# Patient Record
Sex: Female | Born: 1995 | Race: Black or African American | Hispanic: No | Marital: Single | Smoking: Former smoker
Health system: Southern US, Community
[De-identification: ages and names within clinical notes are randomized; demographics above are authoritative.]

## PROBLEM LIST (undated history)

## (undated) DIAGNOSIS — E282 Polycystic ovarian syndrome: Secondary | ICD-10-CM

## (undated) DIAGNOSIS — Z8742 Personal history of other diseases of the female genital tract: Secondary | ICD-10-CM

## (undated) DIAGNOSIS — A749 Chlamydial infection, unspecified: Secondary | ICD-10-CM

## (undated) HISTORY — PX: NO PAST SURGERIES: SHX2092

## (undated) HISTORY — DX: Polycystic ovarian syndrome: E28.2

---
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
## 2015-05-03 ENCOUNTER — Other Ambulatory Visit (HOSPITAL_COMMUNITY)
Admission: RE | Admit: 2015-05-03 | Discharge: 2015-05-03 | Disposition: A | Discharge status: Home / Self Care | Payer: Self-pay | Source: Ambulatory Visit | Attending: Internal Medicine | Admitting: Internal Medicine

## 2015-05-03 ENCOUNTER — Emergency Department (INDEPENDENT_AMBULATORY_CARE_PROVIDER_SITE_OTHER)
Admission: EM | Admit: 2015-05-03 | Discharge: 2015-05-03 | Disposition: A | Discharge status: Home / Self Care | Payer: Self-pay | Source: Home / Self Care | Attending: Internal Medicine | Admitting: Internal Medicine

## 2015-05-03 ENCOUNTER — Encounter (HOSPITAL_COMMUNITY): Payer: Self-pay | Admitting: *Deleted

## 2015-05-03 DIAGNOSIS — N760 Acute vaginitis: Secondary | ICD-10-CM | POA: Insufficient documentation

## 2015-05-03 DIAGNOSIS — N730 Acute parametritis and pelvic cellulitis: Secondary | ICD-10-CM

## 2015-05-03 DIAGNOSIS — L309 Dermatitis, unspecified: Secondary | ICD-10-CM

## 2015-05-03 DIAGNOSIS — Z113 Encounter for screening for infections with a predominantly sexual mode of transmission: Secondary | ICD-10-CM | POA: Insufficient documentation

## 2015-05-03 LAB — POCT URINALYSIS DIP (DEVICE)
Bilirubin Urine: NEGATIVE
Glucose, UA: NEGATIVE mg/dL
Hgb urine dipstick: NEGATIVE
Ketones, ur: NEGATIVE mg/dL
Leukocytes, UA: NEGATIVE
Nitrite: NEGATIVE
Protein, ur: NEGATIVE mg/dL
Specific Gravity, Urine: 1.025 (ref 1.005–1.030)
Urobilinogen, UA: 1 mg/dL (ref 0.0–1.0)
pH: 6 (ref 5.0–8.0)

## 2015-05-03 LAB — POCT PREGNANCY, URINE: PREG TEST UR: NEGATIVE

## 2015-05-03 MED ORDER — AZITHROMYCIN 250 MG PO TABS
ORAL_TABLET | ORAL | Status: AC
  Filled 2015-05-03: qty 4

## 2015-05-03 MED ORDER — TRIAMCINOLONE ACETONIDE 0.5 % EX OINT: 1.0000 "application " | TOPICAL_OINTMENT | Freq: Two times a day (BID) | CUTANEOUS | Status: DC

## 2015-05-03 MED ORDER — AZITHROMYCIN 250 MG PO TABS
1000.0000 mg | ORAL_TABLET | Freq: Once | ORAL | Status: AC
  Administered 2015-05-03: 1000 mg via ORAL

## 2015-05-03 MED ORDER — CEFTRIAXONE SODIUM 250 MG IJ SOLR
INTRAMUSCULAR | Status: AC
  Filled 2015-05-03: qty 250

## 2015-05-03 MED ORDER — CEFTRIAXONE SODIUM 250 MG IJ SOLR
250.0000 mg | Freq: Once | INTRAMUSCULAR | Status: AC
  Administered 2015-05-03: 250 mg via INTRAMUSCULAR

## 2015-05-03 NOTE — ED Provider Notes (Signed)
CSN: [id number]     Arrival date & time 05/03/15  1304 History   First MD Initiated Contact with Patient 05/03/15 1344     Chief Complaint  Patient presents with  . Rash   HPI  19 year old patient with itchy rash on the back of her neck, also on the backs of her fingers. Rash on the back of the neck has been there for about a week; rash on the backs of fingers has been intermittent for years. Additional report of crampy lower abdominal discomfort yesterday; sexually active and not on birth control. Denies urinary symptoms. Denies unusual vaginal discharge or bleeding. Last menstrual period was 03/11/2015; that was the only. She had last year. No fever, malaise.  History reviewed. No pertinent past medical history. History reviewed. No pertinent past surgical history. Family History  Problem Relation Age of Onset  . Asthma Mother   . Diabetes Father    Social History  Substance Use Topics  . Smoking status: Passive Smoke Exposure - Never Smoker  . Smokeless tobacco: None  . Alcohol Use: No    Review of Systems  All other systems reviewed and are negative.   Allergies  Review of patient's allergies indicates no known allergies.  Home Medications  none    BP 111/76 mmHg  Pulse 76  Temp(Src) 98 F (36.7 C) (Oral)  Resp 16  SpO2 98%  LMP 03/11/2015   Physical Exam  Constitutional: She is oriented to person, place, and time. No distress.  Alert, nicely groomed  HENT:  Head: Atraumatic.  Eyes:  Conjugate gaze, no eye redness/drainage  Neck: Neck supple.  Cardiovascular: Normal rate.   Pulmonary/Chest: No respiratory distress.  Abdominal: She exhibits no distension.  Genitourinary:  Normal external female genitalia Copious thin white discharge, no odor Cervix and vaginal mucosa appear inflamed, somewhat friable, no focal lesions Cervical motion tenderness present, left greater than right adnexal tenderness, no adnexal enlargement  Musculoskeletal: Normal range of  motion.  No leg swelling  Neurological: She is alert and oriented to person, place, and time.  Skin: Skin is warm and dry.  No cyanosis Flaky elevated rash on the back of the neck, nontender Lichenification on the backs of the dorsal, distal fingers, occasional fissuring  Nursing note and vitals reviewed.   ED Course  Procedures (including critical care time)  Labs Review  Results for orders placed or performed during the hospital encounter of 05/03/15  POCT urinalysis dip (device)  Result Value Ref Range   Glucose, UA NEGATIVE NEGATIVE mg/dL   Bilirubin Urine NEGATIVE NEGATIVE   Ketones, ur NEGATIVE NEGATIVE mg/dL   Specific Gravity, Urine 1.025 1.005 - 1.030   Hgb urine dipstick NEGATIVE NEGATIVE   pH 6.0 5.0 - 8.0   Protein, ur NEGATIVE NEGATIVE mg/dL   Urobilinogen, UA 1.0 0.0 - 1.0 mg/dL   Nitrite NEGATIVE NEGATIVE   Leukocytes, UA NEGATIVE NEGATIVE  Pregnancy, urine POC  Result Value Ref Range   Preg Test, Ur NEGATIVE NEGATIVE   Swabs for gonorrhea, chlamydia, and wet prep are pending   MDM   1. PID (acute pelvic inflammatory disease)   2. Dermatitis    Meds ordered this encounter  Medications  . cefTRIAXone (ROCEPHIN) injection 250 mg    Sig:   . azithromycin (ZITHROMAX) tablet 1,000 mg    Sig:   . triamcinolone ointment (KENALOG) 0.5 %    Sig: Apply 1 application topically 2 (two) times daily.    Dispense:  453.6 g  Refill:  0    Recheck for persistent symptoms   Eustace Moore, MD 05/09/15 1233

## 2015-05-03 NOTE — Discharge Instructions (Signed)
Urine and pregnancy tests were negative today.  Pelvic exam suggests pelvic inflammatory disease.  Injection of rocephin  and oral dose of zithromax  were given at the urgent care today.  Tests for gonorrhea/chlamydia are pending.  Recheck if pelvic discomfort is not improving in a few days. Rash on hands/neck is suggestive of psoriasis.  A prescription for triamcinolone ointment was sent to the [facility] on [address]  Pelvic Inflammatory Disease Pelvic inflammatory disease (PID) is an infection in some or all of the female organs. PID can be in the uterus, ovaries, fallopian tubes, or the surrounding tissues that are inside the lower belly area (pelvis). PID can lead to lasting problems if it is not treated. To check for this disease, your doctor may:  Do a physical exam.  Do blood tests, urine tests, or a pregnancy test.  Look at your vaginal discharge.  Do tests to look inside the pelvis.  Test you for other infections. HOME CARE  Take over-the-counter and prescription medicines only as told by your doctor.  If you were prescribed an antibiotic medicine, take it as told by your doctor. Do not stop taking it even if you start to feel better.  Do not have sex until treatment is done or as told by your doctor.  Tell your sex partner if you have PID. Your partner may need to be treated.  Keep all follow-up visits as told by your doctor. This is important.  Your doctor may test you for infection again 3 months after you are treated. GET HELP IF:  You have more fluid (discharge) coming from your vagina or fluid that is not normal.  Your pain does not improve.  You throw up (vomit).  You have a fever.  You cannot take your medicines.  Your partner has a sexually transmitted disease (STD).  You have pain when you pee (urinate). GET HELP RIGHT AWAY IF:  You have more belly (abdominal) or lower belly pain.  You have chills.  You are not better after 72 hours.     This information is not intended to replace advice given to you by your health care provider. Make sure you discuss any questions you have with your health care provider.   Document Released: 06/05/2008 Document Revised: 11/28/2014 Document Reviewed: 04/16/2014 Elsevier Interactive Patient Education [employer]

## 2015-05-03 NOTE — ED Notes (Signed)
Pt      Informed   To  Abstain from   Sex  For  10  Days    Phone  Number     Verified        Told  To   Remains  Condom

## 2015-05-03 NOTE — ED Notes (Signed)
Pt  Reports     Skin  Rash      Back  Of  Neck  And  Hands          X  2  Weeks    Works  At  [employer]   She  Also  Reports  Low  abd  Pain  And late  On  Her  Period

## 2015-05-03 NOTE — ED Notes (Signed)
Patient is unable to void at this time 

## 2015-05-06 LAB — CERVICOVAGINAL ANCILLARY ONLY
Chlamydia: NEGATIVE
Neisseria Gonorrhea: NEGATIVE

## 2015-05-07 LAB — CERVICOVAGINAL ANCILLARY ONLY: Wet Prep (BD Affirm): NEGATIVE

## 2015-05-13 ENCOUNTER — Telehealth (HOSPITAL_COMMUNITY): Payer: Self-pay | Admitting: Emergency Medicine

## 2015-05-13 NOTE — ED Notes (Signed)
Called pt and notified of recent lab results from visit 2/10 Pt ID'd properly... Reports feeling better and sx have subsided  Per Dr. Dayton Scrape,  Tests for yeast/bacterial vaginosis/trichomonas, and for chlamydia/gonorrhea were negative. Recheck for persistent symptoms. Ria Clock MD  Adv pt if sx are not getting better to return  Education on safe sex given Pt verb understanding.

## 2015-07-26 ENCOUNTER — Emergency Department (HOSPITAL_COMMUNITY)
Admission: EM | Admit: 2015-07-26 | Discharge: 2015-07-26 | Disposition: A | Discharge status: Home / Self Care | Payer: Medicaid Other | Attending: Emergency Medicine | Admitting: Emergency Medicine

## 2015-07-26 ENCOUNTER — Encounter (HOSPITAL_COMMUNITY): Payer: Self-pay

## 2015-07-26 DIAGNOSIS — Z3202 Encounter for pregnancy test, result negative: Secondary | ICD-10-CM | POA: Insufficient documentation

## 2015-07-26 DIAGNOSIS — R519 Headache, unspecified: Secondary | ICD-10-CM

## 2015-07-26 DIAGNOSIS — R52 Pain, unspecified: Secondary | ICD-10-CM

## 2015-07-26 DIAGNOSIS — R509 Fever, unspecified: Secondary | ICD-10-CM | POA: Insufficient documentation

## 2015-07-26 DIAGNOSIS — R6883 Chills (without fever): Secondary | ICD-10-CM

## 2015-07-26 DIAGNOSIS — R000 Tachycardia, unspecified: Secondary | ICD-10-CM | POA: Insufficient documentation

## 2015-07-26 DIAGNOSIS — R51 Headache: Secondary | ICD-10-CM | POA: Insufficient documentation

## 2015-07-26 LAB — URINALYSIS, ROUTINE W REFLEX MICROSCOPIC
Bilirubin Urine: NEGATIVE
GLUCOSE, UA: NEGATIVE mg/dL
KETONES UR: 15 mg/dL — AB
Nitrite: NEGATIVE
PH: 8 (ref 5.0–8.0)
Protein, ur: 100 mg/dL — AB
SPECIFIC GRAVITY, URINE: 1.015 (ref 1.005–1.030)

## 2015-07-26 LAB — URINE MICROSCOPIC-ADD ON

## 2015-07-26 LAB — POC URINE PREG, ED: Preg Test, Ur: NEGATIVE

## 2015-07-26 MED ORDER — ACETAMINOPHEN 500 MG PO TABS
1000.0000 mg | ORAL_TABLET | Freq: Once | ORAL | Status: AC
  Administered 2015-07-26: 1000 mg via ORAL
  Filled 2015-07-26: qty 2

## 2015-07-26 MED ORDER — IBUPROFEN 800 MG PO TABS
800.0000 mg | ORAL_TABLET | Freq: Once | ORAL | Status: AC
  Administered 2015-07-26: 800 mg via ORAL
  Filled 2015-07-26: qty 1

## 2015-07-26 NOTE — ED Notes (Signed)
Pt states she understands instructions, Home with friend with steady gait.

## 2015-07-26 NOTE — ED Notes (Signed)
Pt started feeling bad last night with chills, headache and body aches.

## 2015-07-26 NOTE — Discharge Instructions (Signed)
You have been seen today for body aches, chills, and headache. Your symptoms are consistent with a viral illness. Viruses do not require antibiotics. Treatment is symptomatic care. Drink plenty of fluids and get plenty of rest. You should be drinking at least a liter of water an hour to stay hydrated. Ibuprofen or Tylenol for pain or fever. Follow up with PCP as needed. Return to ED should symptoms worsen.  RESOURCE GUIDE  Chronic Pain Problems: Contact [facility]  [phone] Patients need to be referred by their primary care doctor.  Insufficient Money for Medicine: Contact United Way:  call "211" or [facility] [phone].  No Primary Care Doctor: - Call [facility]  [phone] - can help you locate a primary care doctor that  accepts your insurance, provides certain services, etc. - Physician Referral Service- [phone]  Agencies that provide inexpensive medical care: - [facility]  [phone] - [facility]  [phone] - [facility]  [phone] - Women's Clinic  [phone] - Planned Parenthood  [phone] [facility] Bast[facility]  [phone]  Medicaid-accepting [facility] Providers: - [facility]- [address], [address]  [phone], Mon-Fri 9am-7pm, Sat 9am-1pm - [facility]- [address], [address] [place]  [phone] - [facility]- [address], [address] [place]  [phone] [facility]- [address]  [phone] - Renaye RakersVeita Bland- [address], [address], [phone]  Only accepts [place] patients after they have their name  applied to their card  Self Pay (no insurance) in [place]: - Sickle Cell Patients: Dr Willey BladeEric Dean, [facility]  [address], [phone] - [facility]- [address]  [phone]       Redge Gainer-     [facility] [place]- [address]       -     [facility]- see information above (Speak to CitigroupPam H if you do not have insurance)       -  [facility]- [address], [phone]       -  [facility]- [address],  [phone]       -  [facility]- [address] [place] [address], [phone]       -  Dr Julio Sickssei-Bonsu-  [address], [address], [place], [phone]       -  [facility]- [address], [phone]       -  [facility] [place]- [address], [phone], also [address], [phone]       -    [facility]- [address], [phone], 1st & 3rd Saturday   every month, 10am-1pm  1) Find a Doctor and Pay Out of Pocket Although you won't have to find out who is covered by your insurance plan, it is a good idea to ask around and get recommendations. You will then need to call the office and see if the doctor you have chosen will accept you as a new patient and what types of options they offer for patients who are self-pay. Some doctors offer discounts or will set up payment plans for their patients who do not have insurance, but you will need to ask so you aren't surprised when you get to your  appointment.  2) Contact Your Local Health Department Not all health departments have doctors that can see patients for sick visits, but many do, so it is worth a call to see if yours does. If you don't know where your local health department is, you can check in your phone book. The CDC also has a tool to help you locate your state's health department, and many state websites also have listings of all of their local health departments.  3) Find a Walk-in Clinic If your illness is not likely to be very severe or complicated, you may want to try a walk in clinic. These are popping up all over the country in pharmacies, drugstores, and shopping centers. They're usually staffed by nurse practitioners or physician assistants that have been trained to treat  common illnesses and complaints. They're usually fairly quick and inexpensive. However, if you have serious medical issues or chronic medical problems, these are probably not your best option  STD Testing - [facility] Department of [facility], STD Clinic, [address], [place], phone [phone] or [phone].  Monday - Friday, call for an appointment. [facility] of [employer], STD Clinic, [address], [place], phone [phone] or [phone].  Monday - Friday, call for an appointment.  Abuse/Neglect: [facility] Child Abuse Hotline 210-210-9296 [facility] Child Abuse Hotline [phone] (After Hours)  Emergency Shelter:  [facility] [phone]  Maternity Homes: - Room at the [place] [facility] 8025287775 - [facility] [phone]  MRSA Hotline #:   807-410-8654  [facility] Resources  [facility] [place]  United Way [facility] Dept. [address]                 [address]         [address]  Blondell Reveal Phone:  [phone]                                  Phone:  [phone]                   Phone:  [phone]  [facility], [phone] - [phone]       -     [facility] in [place], [address]                                  [phone], [facility] Child Abuse Hotline 704-321-8686 or [phone] (After Hours)   Behavioral Health Services  Substance Abuse Resources: - [facility] Drug [facility]  [phone] - [facility] [phone] - [facility] [phone] Floydene Flock [phone] - Residential & Outpatient Substance Abuse Program   4137189138  Psychological Services: - [facility]  [place]  [place], [place] [address] [place], [place]: [phone] or [phone], PicCapture.uy  Dental Assistance  If unable to pay or uninsured, contact:  [facility] or [facility]. to become qualified for the adult dental clinic.  Patients with Medicaid: [facility] [phone] [address], [place] [address], [phone]  If unable to pay, or uninsured, contact [facility] [phone]) or [place] [phone] in [place], [place] in [facility]) to become qualified for the adult dental clinic   Other [place], [place], [place], [address], [place], [place], 2nd and 4th Thursday of the month at 6:30am.  10 clients each day by appointment, can sometimes see walk-in patients if someone does not show for an appointment. [facility]- [address] [place]ard Danker [place], [place], [address], [place], [place], [place], [address], [place] Department- [place] Department- [place] Department- [phone]

## 2015-07-26 NOTE — ED Provider Notes (Signed)
CSN: [id number]     Arrival date & time 07/26/15  [id number] History   First MD Initiated Contact with Patient 07/26/15 [phone]     Chief Complaint  Patient presents with  . Influenza     (Consider location/radiation/quality/duration/timing/severity/associated sxs/prior Treatment) HPI   [place] D Jean RosenthalJackson is a [age] y.o. female, patient with no pertinent past medical history, presenting to the ED with body aches and chills beginning last night. Pt also endorses a headache that began several days ago. Pt rates her body aches and headache at 8/10. Headache is aching, nonradiating. Pt has had headaches before that are similar to this one. Pt endorses contact with a niece with similar symptoms. Denies tick bites or time in forested areas. LMP April 11, but is currently menstruating. Pt requests a pregnancy test. Pt denies cough, shortness of breath, rash, N/V/C/D, or any other complaints.     History reviewed. No pertinent past medical history. History reviewed. No pertinent past surgical history. Family History  Problem Relation Age of Onset  . Asthma Mother   . Diabetes Father    Social History  Substance Use Topics  . Smoking status: Passive Smoke Exposure - Never Smoker    Types: Cigarettes  . Smokeless tobacco: None  . Alcohol Use: Yes   OB History    No data available     Review of Systems  Constitutional: Positive for fever and chills. Negative for diaphoresis.  Respiratory: Negative for cough and shortness of breath.   Gastrointestinal: Negative for nausea, vomiting, abdominal pain, diarrhea and constipation.  Genitourinary: Negative for dysuria, hematuria and flank pain.  Musculoskeletal: Positive for myalgias. Negative for arthralgias.  Skin: Negative for color change, pallor and rash.  Neurological: Positive for headaches. Negative for dizziness, weakness, light-headedness and numbness.  All other systems reviewed and are negative.     Allergies  Review of patient's allergies  indicates no known allergies.  Home Medications   Prior to Admission medications   Medication Sig Start Date End Date Taking? Authorizing Provider  triamcinolone ointment (KENALOG) 0.5 % Apply 1 application topically 2 (two) times daily. Patient not taking: Reported on 07/26/2015 05/03/15   Eustace MooreLaura W Murray, MD   BP 98/59 mmHg  Pulse 107  Temp(Src) 99.6 F (37.6 C) (Oral)  Resp 14  Ht 5\' 4"  (1.626 m)  Wt 90.719 kg  BMI 34.31 kg/m2  SpO2 98%  LMP 07/26/2015 Physical Exam  Constitutional: She is oriented to person, place, and time. She appears well-developed and well-nourished. No distress.  HENT:  Head: Normocephalic and atraumatic.  Eyes: Conjunctivae and EOM are normal. Pupils are equal, round, and reactive to light.  Neck: Normal range of motion. Neck supple.  Cardiovascular: Regular rhythm, normal heart sounds and intact distal pulses.  Tachycardia present.   Pulmonary/Chest: Effort normal and breath sounds normal. No respiratory distress.  Abdominal: Soft. There is no tenderness. There is no guarding.  Musculoskeletal: She exhibits no edema or tenderness.  Full ROM in all extremities and spine. No paraspinal tenderness.   Lymphadenopathy:    She has no cervical adenopathy.  Neurological: She is alert and oriented to person, place, and time. She has normal reflexes.  No sensory deficits. Strength 5/5 in all extremities. No gait disturbance. Coordination intact. Cranial nerves III-XII grossly intact. No facial droop.   Skin: Skin is warm and dry. She is not diaphoretic.  Psychiatric: She has a normal mood and affect. Her behavior is normal.  Nursing note and vitals reviewed.  ED Course  Procedures (including critical care time) Labs Review Labs Reviewed  URINALYSIS, ROUTINE W REFLEX MICROSCOPIC (NOT AT [facility]) - Abnormal; Notable for the following:    Color, Urine RED (*)    APPearance TURBID (*)    Hgb urine dipstick LARGE (*)    Ketones, ur 15 (*)    Protein, ur 100 (*)     Leukocytes, UA SMALL (*)    All other components within normal limits  URINE MICROSCOPIC-ADD ON - Abnormal; Notable for the following:    Squamous Epithelial / LPF 0-5 (*)    Bacteria, UA RARE (*)    All other components within normal limits  POC URINE PREG, ED    Imaging Review No results found. I have personally reviewed and evaluated these lab results as part of my medical decision-making.   EKG Interpretation None      MDM   Final diagnoses:  Body aches  Chills  Acute nonintractable headache, unspecified headache type    [place] D Lunt presents with body aches and chills that began last night. Headache began a few days ago.  Patient has no neurologic or functional deficits. Symptoms consistent with a viral illness. Patient is nontoxic appearing, afebrile, not tachycardic, maintains SPO2 of 96-100% on room air, and is in no apparent distress. Patient has no signs of sepsis or other serious or life-threatening condition. Very low suspicion for ailments such as meningitis or [facility] Spotted Fever. Patient able to readily pass PO challenge without difficulty. Home care and return precautions discussed. Patient to follow up with PCP should symptoms continue. Patient voiced understanding of these instructions and is comfortable with discharge.  Filed Vitals:   07/26/15 0646 07/26/15 0652 07/26/15 0652 07/26/15 0654  BP: 127/74     Pulse:  123    Temp:      TempSrc:      Height:  (1.626 m)     Weight:    90.719 kg  SpO2:  97% 96%    Filed Vitals:   07/26/15 0654 07/26/15 0820 07/26/15 0936 07/26/15 0956  BP:  103/55 98/59   Pulse:  113 107   Temp:  102.7 F (39.3 C)  99.6 F (37.6 C)  TempSrc:  Oral  Oral  Resp:  18 14   Height:      Weight: 90.719 kg     SpO2:  98% 98%         Anselm Pancoast, PA-C 07/26/15 1515  Derwood Kaplan, MD 07/27/15 1210

## 2015-07-26 NOTE — ED Notes (Signed)
Cranberry juice given for PO challenge.  Pt able to ambulate to bathroom, clean catch instructions given.

## 2015-07-29 ENCOUNTER — Encounter (HOSPITAL_COMMUNITY): Payer: Self-pay | Admitting: Emergency Medicine

## 2015-07-29 DIAGNOSIS — H9201 Otalgia, right ear: Secondary | ICD-10-CM | POA: Insufficient documentation

## 2015-07-29 DIAGNOSIS — J029 Acute pharyngitis, unspecified: Secondary | ICD-10-CM | POA: Insufficient documentation

## 2015-07-29 LAB — RAPID STREP SCREEN (MED CTR MEBANE ONLY): Streptococcus, Group A Screen (Direct): POSITIVE — AB

## 2015-07-29 NOTE — ED Notes (Signed)
Pt here for sore throat and otalgia. Pt sts she was seen here on Friday for similar symptoms. Pt a/o x 4, resp e/u.

## 2015-07-30 ENCOUNTER — Emergency Department (HOSPITAL_COMMUNITY)
Admission: EM | Admit: 2015-07-30 | Discharge: 2015-07-30 | Disposition: A | Discharge status: Home / Self Care | Payer: Medicaid Other | Attending: Emergency Medicine | Admitting: Emergency Medicine

## 2015-07-30 DIAGNOSIS — H9201 Otalgia, right ear: Secondary | ICD-10-CM

## 2015-07-30 DIAGNOSIS — J029 Acute pharyngitis, unspecified: Secondary | ICD-10-CM

## 2015-07-30 MED ORDER — KETOROLAC TROMETHAMINE 60 MG/2ML IM SOLN
60.0000 mg | Freq: Once | INTRAMUSCULAR | Status: AC
  Administered 2015-07-30: 60 mg via INTRAMUSCULAR
  Filled 2015-07-30: qty 2

## 2015-07-30 MED ORDER — DEXAMETHASONE SODIUM PHOSPHATE 10 MG/ML IJ SOLN
10.0000 mg | Freq: Once | INTRAMUSCULAR | Status: AC
  Administered 2015-07-30: 10 mg via INTRAMUSCULAR
  Filled 2015-07-30: qty 1

## 2015-07-30 NOTE — Discharge Instructions (Signed)
Earache Linda Nunez, your ears do not show any signs of infection.  You were given medication to treat your pain with your throat and ear.  See a primary care doctor within 3 days for close follow up. Take tylenol and ibuprofen at home as needed for pain. If symptoms worsen, come back to the ED immediately. Thank you. An earache, also called otalgia, can be caused by many things. Pain from an earache can be sharp, dull, or burning. The pain may be temporary or constant. Earaches can be caused by problems with the ear, such as infection in either the middle ear or the ear canal, injury, impacted ear wax, middle ear pressure, or a foreign body in the ear. Ear pain can also result from problems in other areas. This is called referred pain. For example, pain can come from a sore throat, a tooth infection, or problems with the jaw or the joint between the jaw and the skull (temporomandibular joint, or TMJ). The cause of an earache is not always easy to identify. Watchful waiting may be appropriate for some earaches until a clear cause of the pain can be found. HOME CARE INSTRUCTIONS Watch your condition for any changes. The following actions may help to lessen any discomfort that you are feeling:  Take medicines only as directed by your health care provider. This includes ear drops.  Apply ice to your outer ear to help reduce pain.  Put ice in a plastic bag.  Place a towel between your skin and the bag.  Leave the ice on for 20 minutes, 2-3 times per day.  Do not put anything in your ear other than medicine that is prescribed by your health care provider.  Try resting in an upright position instead of lying down. This may help to reduce pressure in the middle ear and relieve pain.  Chew gum if it helps to relieve your ear pain.  Control any allergies that you have.  Keep all follow-up visits as directed by your health care provider. This is important. SEEK MEDICAL CARE IF:  Your pain does not  improve within 2 days.  You have a fever.  You have new or worsening symptoms. SEEK IMMEDIATE MEDICAL CARE IF:  You have a severe headache.  You have a stiff neck.  You have difficulty swallowing.  You have redness or swelling behind your ear.  You have drainage from your ear.  You have hearing loss.  You feel dizzy.   This information is not intended to replace advice given to you by your health care provider. Make sure you discuss any questions you have with your health care provider.   Document Released: 10/25/2003 Document Revised: 03/30/2014 Document Reviewed: 10/08/2013 Elsevier Interactive Patient Education 2016 Elsevier Inc[employer] Pharyngitis Pharyngitis is a sore throat (pharynx). There is redness, pain, and swelling of your throat. HOME CARE   Drink enough fluids to keep your pee (urine) clear or pale yellow.  Only take medicine as told by your doctor.  You may get sick again if you do not take medicine as told. Finish your medicines, even if you start to feel better.  Do not take aspirin.  Rest.  Rinse your mouth (gargle) with salt water ( tsp of salt per 1 qt of water) every 1-2 hours. This will help the pain.  If you are not at risk for choking, you can suck on hard candy or sore throat lozenges. GET HELP IF:  You have large, tender lumps on your neck.  You have a rash.  You cough up green, yellow-brown, or bloody spit. GET HELP RIGHT AWAY IF:   You have a stiff neck.  You drool or cannot swallow liquids.  You throw up (vomit) or are not able to keep medicine or liquids down.  You have very bad pain that does not go away with medicine.  You have problems breathing (not from a stuffy nose). MAKE SURE YOU:   Understand these instructions.  Will watch your condition.  Will get help right away if you are not doing well or get worse.   This information is not intended to replace advice given to you by your health care provider. Make sure you  discuss any questions you have with your health care provider.   Document Released: 08/26/2007 Document Revised: 12/28/2012 Document Reviewed: 11/14/2012 Elsevier Interactive Patient Education [employer] Elsevier Inc[employer]

## 2015-07-30 NOTE — ED Provider Notes (Signed)
CSN: [id number]     Arrival date & time 07/29/15  2150 History  By signing my name below, I, Arianna Nassar, attest that this documentation has been prepared under the direction and in the presence of Tomasita Crumble, MD. Electronically Signed: Octavia Heir, ED Scribe. 07/30/2015. 4:01 AM.    Chief Complaint  Patient presents with  . Sore Throat  . Otalgia      The history is provided by the patient. No language interpreter was used.   HPI Comments: Linda Nunez is a 20 y.o. female who presents to the Emergency Department complaining of constant, gradual worsening, moderate sore throat with associated right ear pain onset 3 days ago. Pt was seen on Friday for the same symptoms and received tylenol to alleviate her symptoms with no relief. She has not taken any medication recently to alleviate her symptoms. She denies fever.   History reviewed. No pertinent past medical history. History reviewed. No pertinent past surgical history. Family History  Problem Relation Age of Onset  . Asthma Mother   . Diabetes Father    Social History  Substance Use Topics  . Smoking status: Passive Smoke Exposure - Never Smoker    Types: Cigarettes  . Smokeless tobacco: None  . Alcohol Use: Yes   OB History    No data available     Review of Systems  A complete 10 system review of systems was obtained and all systems are negative except as noted in the HPI and PMH.    Allergies  Review of patient's allergies indicates no known allergies.  Home Medications   Prior to Admission medications   Medication Sig Start Date End Date Taking? Authorizing Provider  triamcinolone ointment (KENALOG) 0.5 % Apply 1 application topically 2 (two) times daily. Patient not taking: Reported on 07/26/2015 05/03/15   Eustace Moore, MD   Triage vitals: BP 103/71 mmHg  Pulse 95  Temp(Src) 99.6 F (37.6 C) (Oral)  Resp 16  Ht  (1.626 m)  Wt 196 lb 6.4 oz (89.086 kg)  BMI 33.70 kg/m2  SpO2 96%  LMP  07/25/2015 Physical Exam  Constitutional: She is oriented to person, place, and time. She appears well-developed and well-nourished. No distress.  HENT:  Head: Normocephalic and atraumatic.  Nose: Nose normal.  Mouth/Throat: Oropharynx is clear and moist. No oropharyngeal exudate.  No swollen tonsillar, no external ear tenderness, no tonsillar exudates  Eyes: Conjunctivae and EOM are normal. Pupils are equal, round, and reactive to light. No scleral icterus.  Neck: Normal range of motion. Neck supple. No JVD present. No tracheal deviation present. No thyromegaly present.  Cardiovascular: Normal rate, regular rhythm and normal heart sounds.  Exam reveals no gallop and no friction rub.   No murmur heard. Pulmonary/Chest: Effort normal and breath sounds normal. No respiratory distress. She has no wheezes. She exhibits no tenderness.  Abdominal: Soft. Bowel sounds are normal. She exhibits no distension and no mass. There is no tenderness. There is no rebound and no guarding.  Musculoskeletal: Normal range of motion. She exhibits no edema or tenderness.  Lymphadenopathy:    She has no cervical adenopathy.  Neurological: She is alert and oriented to person, place, and time. No cranial nerve deficit. She exhibits normal muscle tone.  Skin: Skin is warm and dry. No rash noted. No erythema. No pallor.  Nursing note and vitals reviewed.   ED Course  Procedures  DIAGNOSTIC STUDIES: Oxygen Saturation is 96% on RA, adequate by my interpretation.  COORDINATION OF CARE:  3:54 AM Discussed treatment plan with pt at bedside and pt agreed to plan.  Labs Review Labs Reviewed  RAPID STREP SCREEN (NOT AT [facility]) - Abnormal; Notable for the following:    Streptococcus, Group A Screen (Direct) POSITIVE (*)    All other components within normal limits    Imaging Review No results found. I have personally reviewed and evaluated these images and lab results as part of my medical decision-making.   EKG  Interpretation None      MDM   Final diagnoses:  None   Patient presents emergency department for sore throat and right ear pain. Physical exam is unremarkable. She likely has a pharyngitis. Strep test was ordered by triage and not clinically indicated.   I do not believe antibiotics are indicated as the patient may be colonized with group A strep. She was given Toradol and Decadron for pain control. Primary care follow-up advised. Tylenol and ibuprofen to be used at home. She appears well and in no acute distress, vital signs within her normal limits and she is safe for discharge.   I personally performed the services described in this documentation, which was scribed in my presence. The recorded information has been reviewed and is accurate.     Tomasita CrumbleAdeleke Niema Carrara, MD 07/30/15 [id number]

## 2015-12-26 ENCOUNTER — Encounter (HOSPITAL_COMMUNITY): Payer: Self-pay | Admitting: *Deleted

## 2015-12-26 DIAGNOSIS — L739 Follicular disorder, unspecified: Secondary | ICD-10-CM | POA: Insufficient documentation

## 2015-12-26 DIAGNOSIS — Z7722 Contact with and (suspected) exposure to environmental tobacco smoke (acute) (chronic): Secondary | ICD-10-CM | POA: Insufficient documentation

## 2015-12-26 NOTE — ED Triage Notes (Signed)
Pt c/o bumps on her legs, thigh, and chest

## 2015-12-27 ENCOUNTER — Emergency Department (HOSPITAL_COMMUNITY)
Admission: EM | Admit: 2015-12-27 | Discharge: 2015-12-27 | Disposition: A | Discharge status: Home / Self Care | Payer: Medicaid Other | Attending: Emergency Medicine | Admitting: Emergency Medicine

## 2015-12-27 DIAGNOSIS — L739 Follicular disorder, unspecified: Secondary | ICD-10-CM

## 2015-12-27 MED ORDER — SULFAMETHOXAZOLE-TRIMETHOPRIM 800-160 MG PO TABS
1.0000 | ORAL_TABLET | Freq: Once | ORAL | Status: AC
  Administered 2015-12-27: 1 via ORAL
  Filled 2015-12-27: qty 1

## 2015-12-27 MED ORDER — SULFAMETHOXAZOLE-TRIMETHOPRIM 800-160 MG PO TABS: 1.0000 | ORAL_TABLET | Freq: Two times a day (BID) | ORAL | 0 refills | Status: AC

## 2015-12-27 NOTE — ED Provider Notes (Signed)
[facility]-EMERGENCY DEPT Provider Note   CSN: [id number] Arrival date & time: 12/26/15  2353     History   Chief Complaint Chief Complaint  Patient presents with  . Rash    HPI Linda Nunez is a 20 y.o. female.  HPI Presents with concern of cutaneous lesions. Symptoms began 2 days ago, since that time she has multiple widely spread small erythematous, P Linda lesions. No fever, chills, nausea, vomiting, other systemic complaints. She notes one prior similar episode 2 years ago, diagnosed with MRSA infection.  History reviewed. No pertinent past medical history.  There are no active problems to display for this patient.   History reviewed. No pertinent surgical history.  OB History    No data available       Home Medications    Prior to Admission medications   Medication Sig Start Date End Date Taking? Authorizing Provider  sulfamethoxazole-trimethoprim (BACTRIM DS,SEPTRA DS) 800-160 MG tablet Take 1 tablet by mouth 2 (two) times daily. 12/27/15 01/03/16  Gerhard Munch, MD  triamcinolone ointment (KENALOG) 0.5 % Apply 1 application topically 2 (two) times daily. Patient not taking: Reported on 07/26/2015 05/03/15   Eustace Moore, MD    Family History Family History  Problem Relation Age of Onset  . Asthma Mother   . Diabetes Father     Social History Social History  Substance Use Topics  . Smoking status: Passive Smoke Exposure - Never Smoker    Types: Cigarettes  . Smokeless tobacco: Never Used  . Alcohol use Yes     Allergies   Review of patient's allergies indicates no known allergies.   Review of Systems Review of Systems  Constitutional:       Per HPI, otherwise negative  HENT:       Per HPI, otherwise negative  Respiratory:       Per HPI, otherwise negative  Cardiovascular:       Per HPI, otherwise negative  Gastrointestinal: Negative for vomiting.  Endocrine:       Negative aside from HPI  Genitourinary:       Neg aside from HPI     Musculoskeletal:       Per HPI, otherwise negative  Skin: Negative.   Neurological: Negative for syncope.     Physical Exam Updated Vital Signs BP 120/78 (BP Location: Left Arm)   Pulse 102   Temp 98.8 F (37.1 C) (Oral)   Resp 16   SpO2 98%   Physical Exam  Constitutional: She is oriented to person, place, and time. She appears well-developed and well-nourished. No distress.  HENT:  Head: Normocephalic and atraumatic.  Eyes: Conjunctivae and EOM are normal.  Cardiovascular: Normal rate and regular rhythm.   Pulmonary/Chest: Effort normal and breath sounds normal. No stridor. No respiratory distress.  Abdominal: She exhibits no distension.  Musculoskeletal: She exhibits no edema.  Neurological: She is alert and oriented to person, place, and time. No cranial nerve deficit.  Skin: Skin is warm and dry. Rash noted.  Throughout the habitus there are multiple areas of erythema, small, less than 1 cm, somewhat purulent focal central areas. No confluent erythema anywhere.   Psychiatric: She has a normal mood and affect.  Nursing note and vitals reviewed.    ED Treatments / Results   Procedures Procedures (including critical care time)  Medications Ordered in ED Medications  sulfamethoxazole-trimethoprim (BACTRIM DS,SEPTRA DS) 800-160 MG per tablet 1 tablet (not administered)     Initial Impression / Assessment and Plan /  ED Course  I have reviewed the triage vital signs and the nursing notes.  Pertinent labs & imaging results that were available during my care of the patient were reviewed by me and considered in my medical decision making (see chart for details).  Clinical Course    It female presents with cutaneous lesions consistent with folliculitis, no evidence for cellulitis or confluent infection. No evidence for bacteremia, sepsis.   Final Clinical Impressions(s) / ED Diagnoses   Final diagnoses:  Folliculitis    New Prescriptions New Prescriptions    SULFAMETHOXAZOLE-TRIMETHOPRIM (BACTRIM DS,SEPTRA DS) 800-160 MG TABLET    Take 1 tablet by mouth 2 (two) times daily.     Gerhard Munchobert Gini Caputo, MD 12/27/15 [phone]

## 2015-12-27 NOTE — ED Notes (Signed)
RN at bedside

## 2015-12-27 NOTE — Discharge Instructions (Signed)
As discussed, your evaluation today has been largely reassuring.  But, it is important that you monitor your condition carefully, and do not hesitate to return to the ED if you develop new, or concerning changes in your condition. ? ?Otherwise, please follow-up with your physician for appropriate ongoing care. ? ?

## 2016-03-11 ENCOUNTER — Ambulatory Visit (HOSPITAL_COMMUNITY)
Admission: EM | Admit: 2016-03-11 | Discharge: 2016-03-11 | Disposition: A | Discharge status: Home / Self Care | Payer: Medicaid Other | Attending: Emergency Medicine | Admitting: Emergency Medicine

## 2016-03-11 ENCOUNTER — Telehealth (HOSPITAL_COMMUNITY): Payer: Self-pay | Admitting: Emergency Medicine

## 2016-03-11 ENCOUNTER — Encounter (HOSPITAL_COMMUNITY): Payer: Self-pay | Admitting: Emergency Medicine

## 2016-03-11 DIAGNOSIS — Z202 Contact with and (suspected) exposure to infections with a predominantly sexual mode of transmission: Secondary | ICD-10-CM

## 2016-03-11 DIAGNOSIS — B009 Herpesviral infection, unspecified: Secondary | ICD-10-CM

## 2016-03-11 DIAGNOSIS — B9689 Other specified bacterial agents as the cause of diseases classified elsewhere: Secondary | ICD-10-CM

## 2016-03-11 DIAGNOSIS — N760 Acute vaginitis: Secondary | ICD-10-CM | POA: Insufficient documentation

## 2016-03-11 DIAGNOSIS — B0089 Other herpesviral infection: Secondary | ICD-10-CM | POA: Insufficient documentation

## 2016-03-11 DIAGNOSIS — Z7722 Contact with and (suspected) exposure to environmental tobacco smoke (acute) (chronic): Secondary | ICD-10-CM | POA: Insufficient documentation

## 2016-03-11 LAB — POCT URINALYSIS DIP (DEVICE)
BILIRUBIN URINE: NEGATIVE
Glucose, UA: NEGATIVE mg/dL
Hgb urine dipstick: NEGATIVE
KETONES UR: NEGATIVE mg/dL
LEUKOCYTES UA: NEGATIVE
NITRITE: NEGATIVE
Protein, ur: NEGATIVE mg/dL
Specific Gravity, Urine: >=1.03 (ref 1.005–1.030)
Urobilinogen, UA: 1 mg/dL (ref 0.0–1.0)
pH: 6 (ref 5.0–8.0)

## 2016-03-11 LAB — POCT PREGNANCY, URINE: PREG TEST UR: NEGATIVE

## 2016-03-11 MED ORDER — ACYCLOVIR 400 MG PO TABS: 400.0000 mg | ORAL_TABLET | Freq: Three times a day (TID) | ORAL | 0 refills | Status: DC

## 2016-03-11 MED ORDER — METRONIDAZOLE 500 MG PO TABS: 500.0000 mg | ORAL_TABLET | Freq: Two times a day (BID) | ORAL | 0 refills | Status: DC

## 2016-03-11 NOTE — Telephone Encounter (Signed)
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  .  Dr Oretha EllisHonig/ [place], NP.  Reports site is left outer labia

## 2016-03-11 NOTE — Discharge Instructions (Signed)
Take prescriptions as directed, avoid consuming any alcohol while on metronidazole, you will be contacted with the results of your tests along with any changes in your treatment plans. Should symptoms fail to improve, follow up with your primary care provider or return to clinic.

## 2016-03-11 NOTE — ED Triage Notes (Signed)
[facility] a [facility] of lower abdominal pain that has been ongoing for 2 days. The patient also complained of a vaginal discharge. The patient denied Dysuria but did report nausea.

## 2016-03-11 NOTE — ED Provider Notes (Signed)
CSN: [id number]     Arrival date & time 03/11/16  1107 History   None    Chief Complaint  Patient presents with  . Abdominal Cramping   (Consider location/radiation/quality/duration/timing/severity/associated sxs/prior Treatment) 20 year old female presents with chief complaint of abdominal cramping and vaginal discharge along with sores in her vaginal area for 2 weeks. Patient is sexually active and has had unprotected sex with her partner. Patient denies odor or itching, denies any vaginal bleeding or spotting. Does complain of pain with intercourse.   The history is provided by the patient.  Abdominal Cramping  This is a new problem. The current episode started more than 1 week ago. The problem occurs constantly. The problem has not changed since onset.Associated symptoms include abdominal pain. Pertinent negatives include no chest pain. Nothing aggravates the symptoms. Nothing relieves the symptoms.    History reviewed. No pertinent past medical history. History reviewed. No pertinent surgical history. Family History  Problem Relation Age of Onset  . Asthma Mother   . Diabetes Father    Social History  Substance Use Topics  . Smoking status: Passive Smoke Exposure - Never Smoker    Types: Cigarettes  . Smokeless tobacco: Never Used  . Alcohol use Yes   OB History    No data available     Review of Systems  Constitutional: Negative for activity change, chills, diaphoresis, fatigue and fever.  Cardiovascular: Negative for chest pain.  Gastrointestinal: Positive for abdominal pain. Negative for abdominal distention, diarrhea, nausea and vomiting.  Genitourinary: Positive for dyspareunia, genital sores and vaginal discharge. Negative for decreased urine volume, difficulty urinating, dysuria, flank pain, frequency, hematuria, pelvic pain, urgency, vaginal bleeding and vaginal pain.    Allergies  Patient has no known allergies.  Home Medications   Prior to Admission  medications   Medication Sig Start Date End Date Taking? Authorizing Provider  acyclovir (ZOVIRAX) 400 MG tablet Take 1 tablet (400 mg total) by mouth 3 (three) times daily. 03/11/16   Dorena BodoLawrence Shylin Keizer, NP  metroNIDAZOLE (FLAGYL) 500 MG tablet Take 1 tablet (500 mg total) by mouth 2 (two) times daily. 03/11/16   Dorena BodoLawrence Alexiss Iturralde, NP  triamcinolone ointment (KENALOG) 0.5 % Apply 1 application topically 2 (two) times daily. Patient not taking: Reported on 07/26/2015 05/03/15   Eustace MooreLaura W Murray, MD   Meds Ordered and Administered this Visit  Medications - No data to display  BP 109/77 (BP Location: Left Arm)   Pulse 82   Temp 98.4 F (36.9 C) (Oral)   Resp 18   SpO2 98%  No data found.   Physical Exam  Constitutional: She is oriented to person, place, and time. She appears well-developed and well-nourished. No distress.  HENT:  Head: Normocephalic.  Neck: Normal range of motion.  Cardiovascular: Normal rate, regular rhythm and normal heart sounds.   Pulmonary/Chest: Effort normal and breath sounds normal.  Abdominal: Soft. Bowel sounds are normal. She exhibits no distension and no mass. There is no tenderness. There is no guarding.  Genitourinary:    Pelvic exam was performed with patient supine. Cervix exhibits discharge. Right adnexum displays no tenderness. Left adnexum displays no tenderness. No erythema or bleeding in the vagina. No foreign body in the vagina. Vaginal discharge found.  Lymphadenopathy:    She has no cervical adenopathy.  Neurological: She is alert and oriented to person, place, and time.  Skin: Skin is warm and dry. Capillary refill takes less than 2 seconds. She is not diaphoretic.  Urgent Care Course   Clinical Course     Procedures (including critical care time)  Labs Review Labs Reviewed  HSV CULTURE AND TYPING  POCT URINALYSIS DIP (DEVICE)  POCT PREGNANCY, URINE  CERVICOVAGINAL ANCILLARY ONLY    Imaging Review No results found.   Visual  Acuity Review  Right Eye Distance:   Left Eye Distance:   Bilateral Distance:    Right Eye Near:   Left Eye Near:    Bilateral Near:         MDM   1. HSV infection   2. BV (bacterial vaginosis)   3. STD exposure    UA and pregnancy test reviewed, wet prep and STD panel collected, Viral culture obtained. RX for flagyl and acyclovir. Patient counseled to avoid alcohol while on flagyl as well as counseled to avoid unprotected sex while on therapy. Should symptoms fail to resolve or worsen follow up with primary care provider or return to clinic.      Dorena BodoLawrence Javion Holmer, NP 03/11/16 1348

## 2016-03-12 LAB — CERVICOVAGINAL ANCILLARY ONLY
Chlamydia: NEGATIVE
Neisseria Gonorrhea: NEGATIVE

## 2016-03-13 LAB — CERVICOVAGINAL ANCILLARY ONLY: WET PREP (BD AFFIRM): POSITIVE — AB

## 2016-03-13 LAB — HSV CULTURE AND TYPING

## 2016-03-31 ENCOUNTER — Encounter (HOSPITAL_COMMUNITY): Payer: Self-pay | Admitting: Emergency Medicine

## 2016-03-31 DIAGNOSIS — Z79899 Other long term (current) drug therapy: Secondary | ICD-10-CM | POA: Insufficient documentation

## 2016-03-31 DIAGNOSIS — R51 Headache: Secondary | ICD-10-CM | POA: Insufficient documentation

## 2016-03-31 DIAGNOSIS — Z7722 Contact with and (suspected) exposure to environmental tobacco smoke (acute) (chronic): Secondary | ICD-10-CM | POA: Insufficient documentation

## 2016-03-31 NOTE — ED Triage Notes (Signed)
Pt c/o headache x's 2 days.  St's she took a tylenol last pm but it did not help.  Nausea without vomiting

## 2016-04-01 ENCOUNTER — Emergency Department (HOSPITAL_COMMUNITY)
Admission: EM | Admit: 2016-04-01 | Discharge: 2016-04-01 | Disposition: A | Discharge status: Home / Self Care | Payer: Medicaid Other | Attending: Emergency Medicine | Admitting: Emergency Medicine

## 2016-04-01 DIAGNOSIS — R51 Headache: Secondary | ICD-10-CM

## 2016-04-01 DIAGNOSIS — R519 Headache, unspecified: Secondary | ICD-10-CM

## 2016-04-01 LAB — I-STAT BETA HCG BLOOD, ED (MC, WL, AP ONLY): I-stat hCG, quantitative: <5 m[IU]/mL (ref <5)

## 2016-04-01 MED ORDER — METOCLOPRAMIDE HCL 5 MG/ML IJ SOLN
10.0000 mg | INTRAMUSCULAR | Status: AC
  Administered 2016-04-01: 10 mg via INTRAMUSCULAR
  Filled 2016-04-01: qty 2

## 2016-04-01 MED ORDER — KETOROLAC TROMETHAMINE 30 MG/ML IJ SOLN
30.0000 mg | Freq: Once | INTRAMUSCULAR | Status: AC
  Administered 2016-04-01: 30 mg via INTRAMUSCULAR
  Filled 2016-04-01: qty 1

## 2016-04-01 MED ORDER — BUTALBITAL-APAP-CAFFEINE 50-325-40 MG PO TABS: 1.0000 | ORAL_TABLET | Freq: Three times a day (TID) | ORAL | 0 refills | Status: DC | PRN

## 2016-04-01 NOTE — ED Notes (Signed)
No adverse reaction to injections pt reports feeling slightly better

## 2016-04-01 NOTE — ED Provider Notes (Signed)
[facility]-EMERGENCY DEPT Provider Note   CSN: [id number] Arrival date & time: 03/31/16  2336    History   Chief Complaint Chief Complaint  Patient presents with  . Headache    HPI Linda Nunez is a 21 y.o. female.  The history is provided by the patient. No language interpreter was used.  Headache   This is a new problem. Episode onset: 4 days ago. The problem occurs constantly. Progression since onset: waxing and waning. Pain location: "top of head" The pain is mild. The pain does not radiate. Associated symptoms include nausea. Pertinent negatives include no fever, no syncope and no vomiting. She has tried acetaminophen for the symptoms. The treatment provided no relief.    History reviewed. No pertinent past medical history.  There are no active problems to display for this patient.   History reviewed. No pertinent surgical history.  OB History    No data available       Home Medications    Prior to Admission medications   Medication Sig Start Date End Date Taking? Authorizing Provider  acyclovir (ZOVIRAX) 400 MG tablet Take 1 tablet (400 mg total) by mouth 3 (three) times daily. 03/11/16   Dorena Bodo, NP  butalbital-acetaminophen-caffeine (FIORICET, ESGIC) [phone] MG tablet Take 1-2 tablets by mouth every 8 (eight) hours as needed for headache. 04/01/16 04/01/17  Antony Madura, PA-C  metroNIDAZOLE (FLAGYL) 500 MG tablet Take 1 tablet (500 mg total) by mouth 2 (two) times daily. 03/11/16   Dorena Bodo, NP  triamcinolone ointment (KENALOG) 0.5 % Apply 1 application topically 2 (two) times daily. Patient not taking: Reported on 07/26/2015 05/03/15   Eustace Moore, MD    Family History Family History  Problem Relation Age of Onset  . Asthma Mother   . Diabetes Father     Social History Social History  Substance Use Topics  . Smoking status: Passive Smoke Exposure - Never Smoker    Types: Cigarettes  . Smokeless tobacco: Never Used  . Alcohol use Yes      Allergies   Patient has no known allergies.   Review of Systems Review of Systems  Constitutional: Negative for fever.  HENT:       +phonophobia  Eyes: Positive for photophobia.  Cardiovascular: Negative for syncope.  Gastrointestinal: Positive for nausea. Negative for vomiting.  Musculoskeletal: Negative for neck stiffness.  Neurological: Positive for headaches. Negative for syncope and weakness.  Ten systems reviewed and are negative for acute change, except as noted in the HPI.     Physical Exam Updated Vital Signs BP 114/75   Pulse 81   Temp 98.3 F (36.8 C) (Oral)   Resp 18   Ht 5\' 4"  (1.626 m)   LMP 03/17/2016 (Exact Date)   SpO2 100%   Physical Exam  Constitutional: She is oriented to person, place, and time. She appears well-developed and well-nourished. No distress.  Nontoxic appearing and in NAD  HENT:  Head: Normocephalic and atraumatic.  Eyes: Conjunctivae and EOM are normal. No scleral icterus.  Neck: Normal range of motion.  No nuchal rigidity or meningismus  Cardiovascular: Normal rate, regular rhythm and intact distal pulses.   Pulmonary/Chest: Effort normal. No respiratory distress.  Respirations even and unlabored  Musculoskeletal: Normal range of motion.  Neurological: She is alert and oriented to person, place, and time. No cranial nerve deficit. She exhibits normal muscle tone. Coordination normal.  GCS 15. Speech is goal oriented. No cranial nerve deficits appreciated; symmetric eyebrow raise, no facial  drooping, tongue midline. Patient has equal grip strength bilaterally with 5/5 strength against resistance in all major muscle groups bilaterally. Sensation to light touch intact. Patient moves extremities without ataxia. Patient ambulatory with steady gait.  Skin: Skin is warm and dry. No rash noted. She is not diaphoretic. No erythema. No pallor.  Psychiatric: She has a normal mood and affect. Her behavior is normal.  Nursing note and vitals  reviewed.    ED Treatments / Results  Labs (all labs ordered are listed, but only abnormal results are displayed) Labs Reviewed  I-STAT BETA HCG BLOOD, ED ([facility], [facility], [facility] ONLY)    EKG  EKG Interpretation None       Radiology No results found.  Procedures Procedures (including critical care time)  Medications Ordered in ED Medications  ketorolac (TORADOL) 30 MG/ML injection 30 mg (30 mg Intramuscular Given 04/01/16 0526)  metoCLOPramide (REGLAN) injection 10 mg (10 mg Intramuscular Given 04/01/16 0528)     Initial Impression / Assessment and Plan / ED Course  I have reviewed the triage vital signs and the nursing notes.  Pertinent labs & imaging results that were available during my care of the patient were reviewed by me and considered in my medical decision making (see chart for details).  Clinical Course     21 year old female presents to the emergency department for evaluation of atraumatic headache. She states that headache has been constant 4 days and present on top of her head. She reports taking Tylenol for symptoms yesterday without relief. No fever, nuchal rigidity, or meningismus to suggest meningitis. Neurologic exam nonfocal.   Patient is alert, smiling, and engaging in conversation. She appears in no discomfort. She has had complete resolution of her headache with Toradol and Reglan. She expresses comfort with discharge. Low suspicion for emergent intracranial process. Will manage supportively on an outpatient basis. Return precautions discussed and provided. Patient discharged in stable condition with no unaddressed concerns.   Final Clinical Impressions(s) / ED Diagnoses   Final diagnoses:  Bad headache    New Prescriptions New Prescriptions   BUTALBITAL-ACETAMINOPHEN-CAFFEINE (FIORICET, ESGIC) [phone] MG TABLET    Take 1-2 tablets by mouth every 8 (eight) hours as needed for headache.     Antony MaduraKelly Micael Barb, PA-C 04/01/16 [id number]    Shon Batonourtney F Horton,  MD 04/05/16 [phone]

## 2016-04-09 ENCOUNTER — Emergency Department (HOSPITAL_COMMUNITY)
Admission: EM | Admit: 2016-04-09 | Discharge: 2016-04-10 | Disposition: A | Discharge status: Home / Self Care | Payer: Medicaid Other | Attending: Emergency Medicine | Admitting: Emergency Medicine

## 2016-04-09 ENCOUNTER — Encounter (HOSPITAL_COMMUNITY): Payer: Self-pay

## 2016-04-09 DIAGNOSIS — Z7722 Contact with and (suspected) exposure to environmental tobacco smoke (acute) (chronic): Secondary | ICD-10-CM | POA: Insufficient documentation

## 2016-04-09 DIAGNOSIS — Z79899 Other long term (current) drug therapy: Secondary | ICD-10-CM | POA: Insufficient documentation

## 2016-04-09 DIAGNOSIS — M25512 Pain in left shoulder: Secondary | ICD-10-CM

## 2016-04-09 DIAGNOSIS — R21 Rash and other nonspecific skin eruption: Secondary | ICD-10-CM

## 2016-04-09 NOTE — ED Triage Notes (Signed)
Pt states that she has L shoulder pain that has been going on for a while, pt states that when she belches the pain is worse, pt states that pain is also in her L arm. Denies CP, denies injury. Pt also states she has a headache that has been going on for a year since she last had her hair braided. Pt has blister like rash on stomach and head that itches her, states rash is not painful.

## 2016-04-10 MED ORDER — PREDNISONE 10 MG PO TABS: ORAL_TABLET | ORAL | 0 refills | Status: DC

## 2016-04-10 NOTE — ED Provider Notes (Signed)
[facility]PT Provider Note   CSN: [id number] Arrival date & time: 04/09/16  2206     History   Chief Complaint Chief Complaint  Patient presents with  . multiple complaints    HPI Linda Nunez is a 21 y.o. female.  Patient with no significant medical history presents with multiple complaints: 1. Rash - recurrent rash to scalp and abdomen. No pain, blisters, drainage. Last episode one year ago for which she cannot recall the diagnosis. Symptoms x 1 month. 2. Headache - started at the same time as the scalp rash. Aching type pain without alleviating or aggravating factors.  3. Left shoulder pain - symptoms started one week ago and persist without known injury. It hurts worse when she moves the shoulder in certain ways. No swelling, neck pain, Left UE weakness or numbness.      The history is provided by the patient. No language interpreter was used.    History reviewed. No pertinent past medical history.  There are no active problems to display for this patient.   History reviewed. No pertinent surgical history.  OB History    No data available       Home Medications    Prior to Admission medications   Medication Sig Start Date End Date Taking? Authorizing Provider  acyclovir (ZOVIRAX) 400 MG tablet Take 1 tablet (400 mg total) by mouth 3 (three) times daily. 03/11/16   Dorena Bodo, NP  butalbital-acetaminophen-caffeine (FIORICET, ESGIC) [phone] MG tablet Take 1-2 tablets by mouth every 8 (eight) hours as needed for headache. 04/01/16 04/01/17  Antony Madura, PA-C  metroNIDAZOLE (FLAGYL) 500 MG tablet Take 1 tablet (500 mg total) by mouth 2 (two) times daily. 03/11/16   Dorena Bodo, NP  triamcinolone ointment (KENALOG) 0.5 % Apply 1 application topically 2 (two) times daily. Patient not taking: Reported on 07/26/2015 05/03/15   Eustace Moore, MD    Family History Family History  Problem Relation Age of Onset  . Asthma Mother   . Diabetes Father       Social History Social History  Substance Use Topics  . Smoking status: Passive Smoke Exposure - Never Smoker    Types: Cigarettes  . Smokeless tobacco: Never Used  . Alcohol use Yes     Allergies   Patient has no known allergies.   Review of Systems Review of Systems  Constitutional: Negative for chills and fever.  HENT: Negative.   Eyes: Negative for photophobia and visual disturbance.  Respiratory: Negative.   Cardiovascular: Negative.   Gastrointestinal: Negative.  Negative for nausea.  Musculoskeletal: Positive for arthralgias.  Skin: Positive for rash.  Neurological: Positive for headaches.     Physical Exam Updated Vital Signs BP 111/78   Pulse 97   Temp 99.2 F (37.3 C)   Resp 18   Ht 5\' 3"  (1.6 m)   Wt 81.6 kg   LMP 03/13/2016   SpO2 100%   BMI 31.89 kg/m   Physical Exam  Constitutional: She is oriented to person, place, and time. She appears well-developed and well-nourished.  HENT:  Scalp has palpable raised, firm, small bumps without pustule or blisters. Nonerythematous. Tender. Predominance of rash is parietal scalp.  Neck: Normal range of motion.  Cardiovascular: Intact distal pulses.   Pulmonary/Chest: Effort normal.  Musculoskeletal: Normal range of motion.  Left shoulder does not have swelling or redness. No palpable tenderness. FROM with most discomfort on abduction. Cervical spine has no midline or paraspinal tenderness. 5/5 grip strength in bilateral  upper extremities that is symmetric.  Neurological: She is alert and oriented to person, place, and time.  Sensation equal in bilateral UE's.   Skin: Skin is warm and dry.  Multiple singular, flat, circular shallow ulcerations across upper abdominal wall. Nontender. No blistering or pustules.      ED Treatments / Results  Labs (all labs ordered are listed, but only abnormal results are displayed) Labs Reviewed - No data to display  EKG  EKG Interpretation None        Radiology No results found.  Procedures Procedures (including critical care time)  Medications Ordered in ED Medications - No data to display   Initial Impression / Assessment and Plan / ED Course  I have reviewed the triage vital signs and the nursing notes.  Pertinent labs & imaging results that were available during my care of the patient were reviewed by me and considered in my medical decision making (see chart for details).    1. Nonspecific rash 2. Left shoulder pain  Patient has left shoulder pain x 1 week. No evidence bony injury. Normal neurologic and vascular exams. Worse with movement. Will treat as musculoskeletal strain injury.  Headache felt associated with the scalp rash. No neurologic concerns.   Rash - nonspecific rash without secondary infection. Will do taper prednisone and refer to dermatology for definitive diagnosis.   Final Clinical Impressions(s) / ED Diagnoses   Final diagnoses:  None    New Prescriptions New Prescriptions   No medications on file     Elpidio AnisShari Karlisa Gaubert, PA-C 04/10/16 0142    Gilda Creasehristopher J Pollina, MD 04/10/16 [phone]

## 2016-04-10 NOTE — Discharge Instructions (Signed)
FOLLOW UP WITH DERMATOLOGY FOR DEFINITIVE DIAGNOSIS OF RECURRENT RASH. TAKE PREDNISONE AS DIRECTED. APPLY HEAT TO THE SHOULDER AS INSTRUCTED. FOLLOW UP WITH YOUR PRIMARY CARE DOCTOR FOR FURTHER EVALUATION IF SYMPTOMS PERSIST OR WORSEN.

## 2016-07-22 ENCOUNTER — Encounter (HOSPITAL_COMMUNITY): Payer: Self-pay | Admitting: Emergency Medicine

## 2016-07-22 DIAGNOSIS — Z79899 Other long term (current) drug therapy: Secondary | ICD-10-CM | POA: Insufficient documentation

## 2016-07-22 DIAGNOSIS — R1030 Lower abdominal pain, unspecified: Secondary | ICD-10-CM | POA: Insufficient documentation

## 2016-07-22 DIAGNOSIS — Z7722 Contact with and (suspected) exposure to environmental tobacco smoke (acute) (chronic): Secondary | ICD-10-CM | POA: Insufficient documentation

## 2016-07-22 LAB — URINALYSIS, ROUTINE W REFLEX MICROSCOPIC
Bilirubin Urine: NEGATIVE
Glucose, UA: NEGATIVE mg/dL
Hgb urine dipstick: NEGATIVE
KETONES UR: NEGATIVE mg/dL
Nitrite: NEGATIVE
PROTEIN: 30 mg/dL — AB
Specific Gravity, Urine: 1.024 (ref 1.005–1.030)
pH: 5 (ref 5.0–8.0)

## 2016-07-22 LAB — CBC
HCT: 33.9 % — ABNORMAL LOW (ref 36.0–46.0)
Hemoglobin: 11.3 g/dL — ABNORMAL LOW (ref 12.0–15.0)
MCH: 29.7 pg (ref 26.0–34.0)
MCHC: 33.3 g/dL (ref 30.0–36.0)
MCV: 89 fL (ref 78.0–100.0)
PLATELETS: 415 10*3/uL — AB (ref 150–400)
RBC: 3.81 MIL/uL — ABNORMAL LOW (ref 3.87–5.11)
RDW: 13.4 % (ref 11.5–15.5)
WBC: 6.9 10*3/uL (ref 4.0–10.5)

## 2016-07-22 LAB — LIPASE, BLOOD: Lipase: 18 U/L (ref 11–51)

## 2016-07-22 LAB — COMPREHENSIVE METABOLIC PANEL
ALBUMIN: 4 g/dL (ref 3.5–5.0)
ALT: 17 U/L (ref 14–54)
AST: 25 U/L (ref 15–41)
Alkaline Phosphatase: 69 U/L (ref 38–126)
Anion gap: 11 (ref 5–15)
BUN: 13 mg/dL (ref 6–20)
CHLORIDE: 100 mmol/L — AB (ref 101–111)
CO2: 23 mmol/L (ref 22–32)
CREATININE: 1.02 mg/dL — AB (ref 0.44–1.00)
Calcium: 9.4 mg/dL (ref 8.9–10.3)
GFR calc Af Amer: >60 mL/min (ref >60)
GFR calc non Af Amer: >60 mL/min (ref >60)
GLUCOSE: 81 mg/dL (ref 65–99)
Potassium: 3.4 mmol/L — ABNORMAL LOW (ref 3.5–5.1)
Sodium: 134 mmol/L — ABNORMAL LOW (ref 135–145)
Total Bilirubin: 0.9 mg/dL (ref 0.3–1.2)
Total Protein: 8.2 g/dL — ABNORMAL HIGH (ref 6.5–8.1)

## 2016-07-22 LAB — I-STAT BETA HCG BLOOD, ED (MC, WL, AP ONLY)

## 2016-07-22 NOTE — ED Triage Notes (Signed)
Patient here with abdominal pain, nausea and vomiting.  She states that it has been going on for since last night.  She does have cramping in her abdomen.  She can keep liquids down, but not food.  She states that she may be pregnant.

## 2016-07-23 ENCOUNTER — Emergency Department (HOSPITAL_COMMUNITY)
Admission: EM | Admit: 2016-07-23 | Discharge: 2016-07-23 | Disposition: A | Discharge status: Home / Self Care | Payer: Medicaid Other | Attending: Emergency Medicine | Admitting: Emergency Medicine

## 2016-07-23 DIAGNOSIS — R1030 Lower abdominal pain, unspecified: Secondary | ICD-10-CM

## 2016-07-23 LAB — WET PREP, GENITAL
SPERM: NONE SEEN
Trich, Wet Prep: NONE SEEN
YEAST WET PREP: NONE SEEN

## 2016-07-23 MED ORDER — METRONIDAZOLE 500 MG PO TABS: 500.0000 mg | ORAL_TABLET | Freq: Two times a day (BID) | ORAL | 0 refills | Status: DC

## 2016-07-23 NOTE — Discharge Instructions (Signed)
Please read and follow all provided instructions.  Your diagnoses today include:  1. Lower abdominal pain     Tests performed today include: Vital signs. See below for your results today.   Medications prescribed:  Take as prescribed   Home care instructions:  Follow any educational materials contained in this packet.  Follow-up instructions: Please follow-up with your primary care provider for further evaluation of symptoms and treatment   Return instructions:  Please return to the Emergency Department if you do not get better, if you get worse, or new symptoms OR  - Fever (temperature greater than 101.29F)  - Bleeding that does not stop with holding pressure to the area    -Severe pain (please note that you may be more sore the day after your accident)  - Chest Pain  - Difficulty breathing  - Severe nausea or vomiting  - Inability to tolerate food and liquids  - Passing out  - Skin becoming red around your wounds  - Change in mental status (confusion or lethargy)  - New numbness or weakness    Please return if you have any other emergent concerns.  Additional Information:  Your vital signs today were: BP 107/71 (BP Location: Right Arm)    Pulse 90    Temp 99.5 F (37.5 C) (Oral)    Resp 16    LMP 07/13/2016    SpO2 100%  If your blood pressure (BP) was elevated above 135/85 this visit, please have this repeated by your doctor within one month. ---------------

## 2016-07-23 NOTE — ED Provider Notes (Signed)
[facility] DEPT Provider Note   CSN: [id number] Arrival date & time: 07/22/16  2025     History   Chief Complaint Chief Complaint  Patient presents with  . Abdominal Pain  . Nausea  . Emesis    HPI Linda Nunez is a 21 y.o. female.  HPI  21 y.o. female, presents to the Emergency Department today complaining of N/V with onset last night. This is intermittent and slowly improving. Notes cramping sensation in abdomen located on bilateral lower aspects. Notes vaginal discharge without bleeding. No dysuria. Pt is sexually active with one partner. Does not use OCP. LMP last month. No CP/SOB. No fevers. No diarrhea. No other symptoms noted.   History reviewed. No pertinent past medical history.  There are no active problems to display for this patient.   History reviewed. No pertinent surgical history.  OB History    No data available       Home Medications    Prior to Admission medications   Medication Sig Start Date End Date Taking? Authorizing Provider  acyclovir (ZOVIRAX) 400 MG tablet Take 1 tablet (400 mg total) by mouth 3 (three) times daily. 03/11/16   Dorena Bodo, NP  butalbital-acetaminophen-caffeine (FIORICET, ESGIC) [phone] MG tablet Take 1-2 tablets by mouth every 8 (eight) hours as needed for headache. 04/01/16 04/01/17  Antony Madura, PA-C  metroNIDAZOLE (FLAGYL) 500 MG tablet Take 1 tablet (500 mg total) by mouth 2 (two) times daily. 03/11/16   Dorena Bodo, NP  predniSONE (DELTASONE) 10 MG tablet Take 6 on day 1 Take 5 on day 2 Take 4 on day 3 Take 3 on day 4 Take 2 on day 5  Take 1 on day 6 04/10/16   Elpidio Anis, PA-C  triamcinolone ointment (KENALOG) 0.5 % Apply 1 application topically 2 (two) times daily. Patient not taking: Reported on 07/26/2015 05/03/15   Eustace Moore, MD    Family History Family History  Problem Relation Age of Onset  . Asthma Mother   . Diabetes Father     Social History Social History  Substance Use Topics    . Smoking status: Passive Smoke Exposure - Never Smoker    Types: Cigarettes  . Smokeless tobacco: Never Used  . Alcohol use Yes     Allergies   Patient has no known allergies.   Review of Systems Review of Systems ROS reviewed and all are negative for acute change except as noted in the HPI.  Physical Exam Updated Vital Signs BP 107/71 (BP Location: Right Arm)   Pulse 90   Temp 99.5 F (37.5 C) (Oral)   Resp 16   LMP 07/13/2016   SpO2 100%   Physical Exam  Constitutional: She is oriented to person, place, and time. Vital signs are normal. She appears well-developed and well-nourished.  HENT:  Head: Normocephalic and atraumatic.  Right Ear: Hearing normal.  Left Ear: Hearing normal.  Eyes: Conjunctivae and EOM are normal. Pupils are equal, round, and reactive to light.  Neck: Normal range of motion. Neck supple.  Cardiovascular: Normal rate, regular rhythm, normal heart sounds and intact distal pulses.   Pulmonary/Chest: Effort normal and breath sounds normal.  Abdominal: Soft. Normal appearance and bowel sounds are normal. There is tenderness in the suprapubic area. There is no rigidity, no rebound, no guarding, no CVA tenderness and negative Murphy's sign.  Musculoskeletal: Normal range of motion.  Neurological: She is alert and oriented to person, place, and time.  Skin: Skin is warm and dry.  Psychiatric: She has a normal mood and affect. Her speech is normal and behavior is normal. Thought content normal.  Nursing note and vitals reviewed.  ED Treatments / Results  Labs (all labs ordered are listed, but only abnormal results are displayed) Labs Reviewed  COMPREHENSIVE METABOLIC PANEL - Abnormal; Notable for the following:       Result Value   Sodium 134 (*)    Potassium 3.4 (*)    Chloride 100 (*)    Creatinine, Ser 1.02 (*)    Total Protein 8.2 (*)    All other components within normal limits  CBC - Abnormal; Notable for the following:    RBC 3.81 (*)     Hemoglobin 11.3 (*)    HCT 33.9 (*)    Platelets 415 (*)    All other components within normal limits  URINALYSIS, ROUTINE W REFLEX MICROSCOPIC - Abnormal; Notable for the following:    Color, Urine AMBER (*)    APPearance CLOUDY (*)    Protein, ur 30 (*)    Leukocytes, UA MODERATE (*)    Bacteria, UA FEW (*)    Squamous Epithelial / LPF TOO NUMEROUS TO COUNT (*)    All other components within normal limits  LIPASE, BLOOD  POC URINE PREG, ED  I-STAT BETA HCG BLOOD, ED ([facility], [facility], [facility] ONLY)   EKG  EKG Interpretation None      Radiology No results found.  Procedures Procedures (including critical care time)  Medications Ordered in ED Medications - No data to display  Initial Impression / Assessment and Plan / ED Course  I have reviewed the triage vital signs and the nursing notes.  Pertinent labs & imaging results that were available during my care of the patient were reviewed by me and considered in my medical decision making (see chart for details).  Final Clinical Impressions(s) / ED Diagnoses  {I have reviewed and evaluated the relevant laboratory values.   {I have reviewed the relevant previous healthcare records.  {I obtained HPI from historian.   ED Course:  Assessment: Pt is a 21 y.o. female who presents with N/V intermittently as well as lower abdominal cramping x 24 hours. Emesis  resolving, but cramping remains. No diarrhea. No fevers. On exam, pt in NAD. Nontoxic/nonseptic appearing. VSS. Afebrile. Lungs CTA. Heart RRR. Abdomen nontender soft.  Pt declined GU exam. Explained that it is required for thoroughness to better her care, but again declined. Opted to swab herself for discharge. Wet prep obtained. GC obtained. UA negative. During evaluation, pt states that she needed to leave to pick mother up. On evaluation of discharge it was noted to have foul odor. Will Rx Flagyl for presumed BV. Doubt acute abnormalities in today's visit. Low concern for PID or torsion.  Plan is to DC home with follow up to PCp. At time of discharge, Patient is in no acute distress. Vital Signs are stable. Patient is able to ambulate. Patient able to tolerate PO.    Disposition/Plan:  DC Home Additional Verbal discharge instructions given and discussed with patient.  Pt Instructed to f/u with PCP in the next week for evaluation and treatment of symptoms. Return precautions given Pt acknowledges and agrees with plan  Supervising Physician Mancel BaleElliott Wentz, MD  Final diagnoses:  Lower abdominal pain    New Prescriptions New Prescriptions   No medications on file     Audry Piliyler Gurfateh Mcclain, PA-C 07/23/16 [phone]    Mancel BaleElliott Wentz, MD 07/23/16 [phone]

## 2016-07-23 NOTE — ED Notes (Signed)
Unable to do discharge vitals due to pt leaving prior to discharge. RN Jazmine informed

## 2016-07-24 LAB — GC/CHLAMYDIA PROBE AMP (~~LOC~~) NOT AT ARMC
CHLAMYDIA, DNA PROBE: POSITIVE — AB
Neisseria Gonorrhea: NEGATIVE

## 2016-07-27 ENCOUNTER — Encounter (HOSPITAL_COMMUNITY): Payer: Self-pay | Admitting: Emergency Medicine

## 2016-07-27 ENCOUNTER — Ambulatory Visit (HOSPITAL_COMMUNITY)
Admission: EM | Admit: 2016-07-27 | Discharge: 2016-07-27 | Disposition: A | Discharge status: Home / Self Care | Payer: Medicaid Other | Attending: Internal Medicine | Admitting: Internal Medicine

## 2016-07-27 DIAGNOSIS — A749 Chlamydial infection, unspecified: Secondary | ICD-10-CM | POA: Diagnosis not present

## 2016-07-27 HISTORY — DX: Chlamydial infection, unspecified: A74.9

## 2016-07-27 MED ORDER — AZITHROMYCIN 250 MG PO TABS
1000.0000 mg | ORAL_TABLET | Freq: Once | ORAL | Status: AC
  Administered 2016-07-27: 1000 mg via ORAL

## 2016-07-27 MED ORDER — AZITHROMYCIN 250 MG PO TABS
ORAL_TABLET | ORAL | Status: AC
  Filled 2016-07-27: qty 4

## 2016-07-27 NOTE — Discharge Instructions (Signed)
Safe sex or abstain for 7 to 10 days. [facility]

## 2016-07-27 NOTE — ED Triage Notes (Signed)
[facility] and stated that she was contacted to return for treatment of STD.

## 2016-08-01 NOTE — ED Provider Notes (Signed)
Patient presented to urgent care for followup of positive test result (chlamydia) obtained during ED visit 5/3.  Dose of zithromax 1g po given x 1.  LM   Eustace MooreMurray, Jaree Trinka W, MD 08/01/16 [phone]

## 2016-08-25 ENCOUNTER — Encounter (HOSPITAL_COMMUNITY): Payer: Self-pay | Admitting: Family Medicine

## 2016-08-25 ENCOUNTER — Ambulatory Visit (HOSPITAL_COMMUNITY)
Admission: EM | Admit: 2016-08-25 | Discharge: 2016-08-25 | Disposition: A | Discharge status: Home / Self Care | Payer: Medicaid Other | Attending: Internal Medicine | Admitting: Internal Medicine

## 2016-08-25 DIAGNOSIS — N898 Other specified noninflammatory disorders of vagina: Secondary | ICD-10-CM | POA: Insufficient documentation

## 2016-08-25 DIAGNOSIS — Z202 Contact with and (suspected) exposure to infections with a predominantly sexual mode of transmission: Secondary | ICD-10-CM

## 2016-08-25 DIAGNOSIS — A64 Unspecified sexually transmitted disease: Secondary | ICD-10-CM | POA: Insufficient documentation

## 2016-08-25 DIAGNOSIS — Z7722 Contact with and (suspected) exposure to environmental tobacco smoke (acute) (chronic): Secondary | ICD-10-CM | POA: Insufficient documentation

## 2016-08-25 MED ORDER — CEFTRIAXONE SODIUM 250 MG IJ SOLR
INTRAMUSCULAR | Status: AC
  Filled 2016-08-25: qty 250

## 2016-08-25 MED ORDER — LIDOCAINE HCL (PF) 1 % IJ SOLN
INTRAMUSCULAR | Status: AC
  Filled 2016-08-25: qty 2

## 2016-08-25 MED ORDER — AZITHROMYCIN 250 MG PO TABS
1000.0000 mg | ORAL_TABLET | Freq: Once | ORAL | Status: AC
  Administered 2016-08-25: 1000 mg via ORAL

## 2016-08-25 MED ORDER — AZITHROMYCIN 250 MG PO TABS
ORAL_TABLET | ORAL | Status: AC
  Filled 2016-08-25: qty 4

## 2016-08-25 MED ORDER — CEFTRIAXONE SODIUM 250 MG IJ SOLR
250.0000 mg | Freq: Once | INTRAMUSCULAR | Status: AC
  Administered 2016-08-25: 250 mg via INTRAMUSCULAR

## 2016-08-25 NOTE — ED Provider Notes (Signed)
CSN: [id number]     Arrival date & time 08/25/16  1735 History   None    Chief Complaint  Patient presents with  . SEXUALLY TRANSMITTED DISEASE   (Consider location/radiation/quality/duration/timing/severity/associated sxs/prior Treatment) Patient states she has been exposed to chlamydia and she discharge.   The history is provided by the patient.  Vaginal Discharge  Quality:  Yellow Severity:  Mild Onset quality:  Sudden Duration:  2 days Timing:  Constant Progression:  Worsening Chronicity:  New   Past Medical History:  Diagnosis Date  . Chlamydia    History reviewed. No pertinent surgical history. Family History  Problem Relation Age of Onset  . Asthma Mother   . Diabetes Father    Social History  Substance Use Topics  . Smoking status: Passive Smoke Exposure - Never Smoker    Types: Cigarettes  . Smokeless tobacco: Never Used  . Alcohol use Yes   OB History    No data available     Review of Systems  Constitutional: Negative.   HENT: Negative.   Eyes: Negative.   Respiratory: Negative.   Cardiovascular: Negative.   Gastrointestinal: Negative.   Endocrine: Negative.   Genitourinary: Positive for vaginal discharge.  Musculoskeletal: Negative.   Allergic/Immunologic: Negative.   Neurological: Negative.   Hematological: Negative.   Psychiatric/Behavioral: Negative.     Allergies  Patient has no known allergies.  Home Medications   Prior to Admission medications   Medication Sig Start Date End Date Taking? Authorizing Provider  butalbital-acetaminophen-caffeine (FIORICET, ESGIC) [phone] MG tablet Take 1-2 tablets by mouth every 8 (eight) hours as needed for headache. 04/01/16 04/01/17  Antony MaduraHumes, Kelly, PA-C   Meds Ordered and Administered this Visit   Medications  cefTRIAXone (ROCEPHIN) injection 250 mg (250 mg Intramuscular Given 08/25/16 1949)  azithromycin (ZITHROMAX) tablet 1,000 mg (1,000 mg Oral Given 08/25/16 1949)    BP (!) 104/58   Pulse 74    Temp 98.4 F (36.9 C)   Resp 16   SpO2 100%  No data found.   Physical Exam  Constitutional: She appears well-developed and well-nourished.  HENT:  Head: Normocephalic and atraumatic.  Right Ear: External ear normal.  Left Ear: External ear normal.  Mouth/Throat: Oropharynx is clear and moist.  Eyes: Conjunctivae and EOM are normal. Pupils are equal, round, and reactive to light.  Neck: Normal range of motion. Neck supple.  Cardiovascular: Normal rate, regular rhythm and normal heart sounds.   Pulmonary/Chest: Effort normal. She has wheezes.  Nursing note and vitals reviewed.   Urgent Care Course     Procedures (including critical care time)  Labs Review Labs Reviewed  CERVICOVAGINAL ANCILLARY ONLY    Imaging Review No results found.   Visual Acuity Review  Right Eye Distance:   Left Eye Distance:   Bilateral Distance:    Right Eye Near:   Left Eye Near:    Bilateral Near:         MDM   1. Vaginal discharge   2. STD (female)    Rocephin 250mg  IM Azithromycin 250mg  x 4  Vaginal Cytology GC Chlamydia [address] [facility]      Linda CanterOxford, Linda Nunez, [place]NP 08/25/16 2044

## 2016-08-25 NOTE — ED Triage Notes (Signed)
Pt here for treatment for chlamydia  

## 2016-08-27 LAB — CERVICOVAGINAL ANCILLARY ONLY
Bacterial vaginitis: NEGATIVE
Candida vaginitis: NEGATIVE
Chlamydia: NEGATIVE
Neisseria Gonorrhea: NEGATIVE
Trichomonas: NEGATIVE

## 2016-08-31 ENCOUNTER — Encounter (HOSPITAL_COMMUNITY): Payer: Self-pay

## 2016-08-31 ENCOUNTER — Inpatient Hospital Stay (HOSPITAL_COMMUNITY)
Admission: EM | Admit: 2016-08-31 | Discharge: 2016-09-03 | DRG: 871 | Disposition: A | Discharge status: Home / Self Care | Payer: Self-pay | Attending: Internal Medicine | Admitting: Internal Medicine

## 2016-08-31 ENCOUNTER — Emergency Department (HOSPITAL_COMMUNITY): Payer: Self-pay

## 2016-08-31 DIAGNOSIS — A419 Sepsis, unspecified organism: Principal | ICD-10-CM | POA: Diagnosis present

## 2016-08-31 DIAGNOSIS — Z825 Family history of asthma and other chronic lower respiratory diseases: Secondary | ICD-10-CM

## 2016-08-31 DIAGNOSIS — R000 Tachycardia, unspecified: Secondary | ICD-10-CM

## 2016-08-31 DIAGNOSIS — Z833 Family history of diabetes mellitus: Secondary | ICD-10-CM

## 2016-08-31 DIAGNOSIS — R197 Diarrhea, unspecified: Secondary | ICD-10-CM | POA: Diagnosis present

## 2016-08-31 DIAGNOSIS — R0602 Shortness of breath: Secondary | ICD-10-CM

## 2016-08-31 DIAGNOSIS — R509 Fever, unspecified: Secondary | ICD-10-CM

## 2016-08-31 DIAGNOSIS — R112 Nausea with vomiting, unspecified: Secondary | ICD-10-CM | POA: Diagnosis present

## 2016-08-31 DIAGNOSIS — Z3202 Encounter for pregnancy test, result negative: Secondary | ICD-10-CM | POA: Diagnosis present

## 2016-08-31 DIAGNOSIS — J189 Pneumonia, unspecified organism: Secondary | ICD-10-CM | POA: Diagnosis present

## 2016-08-31 DIAGNOSIS — E876 Hypokalemia: Secondary | ICD-10-CM | POA: Diagnosis present

## 2016-08-31 LAB — WET PREP, GENITAL
CLUE CELLS WET PREP: NONE SEEN
SPERM: NONE SEEN
Trich, Wet Prep: NONE SEEN

## 2016-08-31 LAB — COMPREHENSIVE METABOLIC PANEL
ALBUMIN: 3.8 g/dL (ref 3.5–5.0)
ALT: 32 U/L (ref 14–54)
AST: 46 U/L — AB (ref 15–41)
Alkaline Phosphatase: 88 U/L (ref 38–126)
Anion gap: 14 (ref 5–15)
BILIRUBIN TOTAL: 1.1 mg/dL (ref 0.3–1.2)
BUN: 5 mg/dL — AB (ref 6–20)
CHLORIDE: 96 mmol/L — AB (ref 101–111)
CO2: 23 mmol/L (ref 22–32)
CREATININE: 1.19 mg/dL — AB (ref 0.44–1.00)
Calcium: 9.3 mg/dL (ref 8.9–10.3)
GFR calc Af Amer: >60 mL/min (ref >60)
GLUCOSE: 105 mg/dL — AB (ref 65–99)
Potassium: 3.6 mmol/L (ref 3.5–5.1)
Sodium: 133 mmol/L — ABNORMAL LOW (ref 135–145)
Total Protein: 8.3 g/dL — ABNORMAL HIGH (ref 6.5–8.1)

## 2016-08-31 LAB — CBC
HEMATOCRIT: 35.8 % — AB (ref 36.0–46.0)
Hemoglobin: 12.3 g/dL (ref 12.0–15.0)
MCH: 30 pg (ref 26.0–34.0)
MCHC: 34.4 g/dL (ref 30.0–36.0)
MCV: 87.3 fL (ref 78.0–100.0)
Platelets: 283 10*3/uL (ref 150–400)
RBC: 4.1 MIL/uL (ref 3.87–5.11)
RDW: 12.8 % (ref 11.5–15.5)
WBC: 12.7 10*3/uL — ABNORMAL HIGH (ref 4.0–10.5)

## 2016-08-31 LAB — URINALYSIS, ROUTINE W REFLEX MICROSCOPIC
Bilirubin Urine: NEGATIVE
Glucose, UA: NEGATIVE mg/dL
HGB URINE DIPSTICK: NEGATIVE
Ketones, ur: 80 mg/dL — AB
LEUKOCYTES UA: NEGATIVE
Nitrite: NEGATIVE
PROTEIN: 30 mg/dL — AB
Specific Gravity, Urine: 1.031 — ABNORMAL HIGH (ref 1.005–1.030)
pH: 5 (ref 5.0–8.0)

## 2016-08-31 LAB — I-STAT BETA HCG BLOOD, ED (MC, WL, AP ONLY): HCG, QUANTITATIVE: 13 m[IU]/mL — AB (ref <5)

## 2016-08-31 LAB — LIPASE, BLOOD: LIPASE: 17 U/L (ref 11–51)

## 2016-08-31 LAB — I-STAT CG4 LACTIC ACID, ED
Lactic Acid, Venous: 1.99 mmol/L (ref 0.5–1.9)
Lactic Acid, Venous: 1.1 mmol/L (ref 0.5–1.9)

## 2016-08-31 LAB — HCG, QUANTITATIVE, PREGNANCY: HCG, BETA CHAIN, QUANT, S: 2 m[IU]/mL (ref <5)

## 2016-08-31 MED ORDER — VANCOMYCIN HCL IN DEXTROSE 1-5 GM/200ML-% IV SOLN
1000.0000 mg | Freq: Once | INTRAVENOUS | Status: AC
  Administered 2016-08-31: 1000 mg via INTRAVENOUS
  Filled 2016-08-31: qty 200

## 2016-08-31 MED ORDER — ACETAMINOPHEN 325 MG PO TABS
650.0000 mg | ORAL_TABLET | Freq: Once | ORAL | Status: AC
  Administered 2016-08-31: 650 mg via ORAL
  Filled 2016-08-31: qty 2

## 2016-08-31 MED ORDER — KETOROLAC TROMETHAMINE 30 MG/ML IJ SOLN
30.0000 mg | Freq: Once | INTRAMUSCULAR | Status: AC
  Administered 2016-08-31: 30 mg via INTRAVENOUS
  Filled 2016-08-31: qty 1

## 2016-08-31 MED ORDER — ONDANSETRON HCL 4 MG/2ML IJ SOLN: 4.0000 mg | Freq: Once | INTRAMUSCULAR | Status: DC

## 2016-08-31 MED ORDER — SODIUM CHLORIDE 0.9 % IV BOLUS (SEPSIS)
1000.0000 mL | Freq: Once | INTRAVENOUS | Status: AC
  Administered 2016-08-31: 1000 mL via INTRAVENOUS

## 2016-08-31 MED ORDER — AZITHROMYCIN 250 MG PO TABS
500.0000 mg | ORAL_TABLET | Freq: Once | ORAL | Status: AC
  Administered 2016-08-31: 500 mg via ORAL
  Filled 2016-08-31: qty 2

## 2016-08-31 MED ORDER — DEXTROSE 5 % IV SOLN
2.0000 g | Freq: Once | INTRAVENOUS | Status: AC
  Administered 2016-08-31: 2 g via INTRAVENOUS
  Filled 2016-08-31: qty 2

## 2016-08-31 MED ORDER — ACETAMINOPHEN 500 MG PO TABS: 1000.0000 mg | ORAL_TABLET | Freq: Once | ORAL | Status: DC

## 2016-08-31 MED ORDER — PIPERACILLIN-TAZOBACTAM 3.375 G IVPB
3.3750 g | Freq: Three times a day (TID) | INTRAVENOUS | Status: DC
  Filled 2016-08-31: qty 50

## 2016-08-31 MED ORDER — SODIUM CHLORIDE 0.9 % IV SOLN
Freq: Once | INTRAVENOUS | Status: AC
  Administered 2016-08-31: 23:00:00 via INTRAVENOUS

## 2016-08-31 MED ORDER — PIPERACILLIN-TAZOBACTAM 3.375 G IVPB 30 MIN
3.3750 g | Freq: Once | INTRAVENOUS | Status: AC
  Administered 2016-08-31: 3.375 g via INTRAVENOUS
  Filled 2016-08-31: qty 50

## 2016-08-31 MED ORDER — ONDANSETRON HCL 4 MG/2ML IJ SOLN
4.0000 mg | Freq: Once | INTRAMUSCULAR | Status: AC | PRN
  Administered 2016-08-31: 4 mg via INTRAVENOUS
  Filled 2016-08-31: qty 2

## 2016-08-31 MED ORDER — VANCOMYCIN HCL IN DEXTROSE 750-5 MG/150ML-% IV SOLN
750.0000 mg | Freq: Three times a day (TID) | INTRAVENOUS | Status: DC
  Filled 2016-08-31 (×2): qty 150

## 2016-08-31 MED ORDER — SODIUM CHLORIDE 0.9 % IV BOLUS (SEPSIS)
500.0000 mL | Freq: Once | INTRAVENOUS | Status: AC
  Administered 2016-08-31: 500 mL via INTRAVENOUS

## 2016-08-31 MED ORDER — ONDANSETRON HCL 4 MG/2ML IJ SOLN
4.0000 mg | Freq: Once | INTRAMUSCULAR | Status: AC
  Administered 2016-08-31: 4 mg via INTRAVENOUS
  Filled 2016-08-31: qty 2

## 2016-08-31 NOTE — ED Provider Notes (Signed)
Patient seen and evaluated. Discussed with PA. Initial pregnancy test positive. Bedside ultrasound shows no fluid in the cul-de-sac and no visible uterine or adnexal mass on limited bedside ultrasound.  Patient has had over 3 L of fluid. Remains febrile tachycardic. No obvious source for infection. Initial lactate 1.9. Second is improved. Urine does not show obvious infection. Still shows ketones and protein. Pelvic exam does not show any signs of PID. Plan abdominal exam. Clear lungs.  She is not hypoxemic. She does complain of back pain. Chest x-ray shows possible retrocardiac density. We will her lateral chest x-ray. She remains symptomatically tachycardic. Plan is admission.  CRITICAL CARE Performed by: Rolland PorterJAMES, Nelva Hauk JOSEPH   Total critical care time: 30 minutes  Critical care time was exclusive of separately billable procedures and treating other patients.  Critical care was necessary to treat or prevent imminent or life-threatening deterioration.  Critical care was time spent personally by me on the following activities: development of treatment plan with patient and/or surrogate as well as nursing, discussions with consultants, evaluation of patient's response to treatment, examination of patient, obtaining history from patient or surrogate, ordering and performing treatments and interventions, ordering and review of laboratory studies, ordering and review of radiographic studies, pulse oximetry and re-evaluation of patient's condition.    Rolland PorterJames, Allysson Rinehimer, MD 08/31/16 2240

## 2016-08-31 NOTE — ED Provider Notes (Signed)
[facility] Provider Note   CSN: [id number] Arrival date & time: 08/31/16  1804     History   Chief Complaint Chief Complaint  Patient presents with  . Nausea  . Emesis    HPI [place] D Muscato is a 21 y.o. female who presents via EMS for 3 days of nausea/vomiting. Patient reports that over the laceration and multiple episodes of running. She states that emesis is nonbloody and nonbilious. She also reports 3 episodes of diarrhea over the same period. Patient states that she called EMS today because she was continuing to have symptoms and started having chills. Patient reports that she has not been able to tolerate any by mouth for the last 3 days. She was recently seen at urgent care on 6/05/184 treatment of GC/chlamydia She denies any abdominal pain, dysuria, hematuria, chest pain, difficulty breathing, cough, headache, vision changes.   The history is provided by the patient.    Past Medical History:  Diagnosis Date  . Chlamydia     There are no active problems to display for this patient.   History reviewed. No pertinent surgical history.  OB History    Gravida Para Term Preterm AB Living   1             SAB TAB Ectopic Multiple Live Births                   Home Medications    Prior to Admission medications   Medication Sig Start Date End Date Taking? Authorizing Provider  acetaminophen (TYLENOL) 500 MG tablet Take 500-1,000 mg by mouth every 6 (six) hours as needed for mild pain, fever or headache.    Yes [provider]  ibuprofen (ADVIL,MOTRIN) 200 MG tablet Take 200-400 mg by mouth every 6 (six) hours as needed for fever, headache or mild pain.   Yes [provider]  butalbital-acetaminophen-caffeine (FIORICET, ESGIC) 50-325-40 MG tablet Take 1-2 tablets by mouth every 8 (eight) hours as needed for headache. Patient not taking: Reported on 08/31/2016 04/01/16 04/01/17  Antony Madura, PA-C    Family History Family History  Problem  Relation Age of Onset  . Asthma Mother   . Diabetes Father     Social History Social History  Substance Use Topics  . Smoking status: Passive Smoke Exposure - Never Smoker    Types: Cigarettes  . Smokeless tobacco: Never Used  . Alcohol use Yes     Allergies   Latex   Review of Systems Review of Systems  Constitutional: Positive for chills. Negative for fever.  Respiratory: Negative for cough and shortness of breath.   Cardiovascular: Negative for chest pain.  Gastrointestinal: Positive for diarrhea, nausea and vomiting. Negative for abdominal pain.  Genitourinary: Negative for dysuria and hematuria.  Musculoskeletal: Negative for back pain and neck pain.  Skin: Negative for rash.  Neurological: Negative for dizziness, weakness, numbness and headaches.  All other systems reviewed and are negative.    Physical Exam Updated Vital Signs BP 116/79   Pulse (!) 108   Temp (!) 102.1 F (38.9 C) (Rectal)   Resp 18   Wt 80.7 kg (178 lb)   LMP 07/13/2016   SpO2 100%   BMI 31.53 kg/m   Physical Exam  Constitutional: She is oriented to person, place, and time. She appears well-developed and well-nourished.  Appears uncomfortable  HENT:  Head: Normocephalic and atraumatic.  Mouth/Throat: Oropharynx is clear and moist and mucous membranes are normal.  Eyes: Conjunctivae, EOM and  lids are normal. Pupils are equal, round, and reactive to light.  Neck: Full passive range of motion without pain.  Cardiovascular: Regular rhythm, normal heart sounds and normal pulses.  Tachycardia present.  Exam reveals no gallop and no friction rub.   No murmur heard. Pulmonary/Chest: Effort normal and breath sounds normal.  Abdominal: Soft. Normal appearance. There is no tenderness. There is no rigidity and no guarding.  Genitourinary: Vagina normal and uterus normal. Cervix exhibits no motion tenderness and no friability. Right adnexum displays no mass and no tenderness. Left adnexum displays  no mass and no tenderness.  Genitourinary Comments: The exam was performed with a chaperone present. Normal external female genitalia. No evidence of vulva lesions. She does have some white, mucopurulent discharge. No presence of blood in the vaginal vault. No friability of the cervix. No CMT. No adnexal mass or tenderness. No uterine tenderness.   Musculoskeletal: Normal range of motion.  Neurological: She is alert and oriented to person, place, and time.  Skin: Skin is warm and dry. Capillary refill takes less than 2 seconds.  Psychiatric: She has a normal mood and affect. Her speech is normal.  Nursing note and vitals reviewed.    ED Treatments / Results  Labs (all labs ordered are listed, but only abnormal results are displayed) Labs Reviewed  WET PREP, GENITAL - Abnormal; Notable for the following:       Result Value   Yeast Wet Prep HPF POC PRESENT (*)    WBC, Wet Prep HPF POC MODERATE (*)    All other components within normal limits  COMPREHENSIVE METABOLIC PANEL - Abnormal; Notable for the following:    Sodium 133 (*)    Chloride 96 (*)    Glucose, Bld 105 (*)    BUN 5 (*)    Creatinine, Ser 1.19 (*)    Total Protein 8.3 (*)    AST 46 (*)    All other components within normal limits  CBC - Abnormal; Notable for the following:    WBC 12.7 (*)    HCT 35.8 (*)    All other components within normal limits  URINALYSIS, ROUTINE W REFLEX MICROSCOPIC - Abnormal; Notable for the following:    APPearance HAZY (*)    Specific Gravity, Urine 1.031 (*)    Ketones, ur 80 (*)    Protein, ur 30 (*)    Bacteria, UA RARE (*)    Squamous Epithelial / LPF 0-5 (*)    All other components within normal limits  I-STAT BETA HCG BLOOD, ED ([facility], [facility], [facility] ONLY) - Abnormal; Notable for the following:    I-stat hCG, quantitative 13.0 (*)    All other components within normal limits  I-STAT CG4 LACTIC ACID, ED - Abnormal; Notable for the following:    Lactic Acid, Venous 1.99 (*)    All other  components within normal limits  CULTURE, BLOOD (ROUTINE X 2)  CULTURE, BLOOD (ROUTINE X 2)  URINE CULTURE  LIPASE, BLOOD  HCG, QUANTITATIVE, PREGNANCY  I-STAT CG4 LACTIC ACID, ED  GC/CHLAMYDIA PROBE AMP ([place]) NOT AT [facility]    EKG  EKG Interpretation None       Radiology Dg Chest Port 1 View  Result Date: 08/31/2016 CLINICAL DATA:  Sepsis, pt brought in by EMS with n/v/d. Fever and chills. Tachy with EMS. Pt reports she hasn't eaten in days. Fever of 105.6 here. EXAM: PORTABLE CHEST 1 VIEW COMPARISON:  None. FINDINGS: Faint retrocardiac density on the left, uncertain significance. The  lungs appear otherwise clear. Cardiac and mediastinal margins appear normal. The patient is rotated to the right on today's radiograph, reducing diagnostic sensitivity and specificity. IMPRESSION: 1. Faint left retrocardiac density could reflect atelectasis or pneumonia. If feasible, lateral projection may help in further characterization. Electronically Signed   By: Gaylyn RongWalter  Liebkemann M.D.   On: 08/31/2016 20:57    Procedures Procedures (including critical care time)  Medications Ordered in ED Medications  vancomycin (VANCOCIN) IVPB 750 mg/150 ml premix (not administered)  piperacillin-tazobactam (ZOSYN) IVPB 3.375 g (not administered)  0.9 %  sodium chloride infusion (not administered)  ondansetron (ZOFRAN) injection 4 mg (4 mg Intravenous Given 08/31/16 1835)  acetaminophen (TYLENOL) tablet 650 mg (650 mg Oral Given 08/31/16 1835)  sodium chloride 0.9 % bolus 1,000 mL (0 mLs Intravenous Stopped 08/31/16 2009)  sodium chloride 0.9 % bolus 1,000 mL (0 mLs Intravenous Stopped 08/31/16 2114)    And  sodium chloride 0.9 % bolus 1,000 mL (0 mLs Intravenous Stopped 08/31/16 2216)    And  sodium chloride 0.9 % bolus 500 mL (0 mLs Intravenous Stopped 08/31/16 2242)  piperacillin-tazobactam (ZOSYN) IVPB 3.375 g (0 g Intravenous Stopped 08/31/16 2114)  vancomycin (VANCOCIN) IVPB 1000 mg/200 mL premix (0  mg Intravenous Stopped 08/31/16 2215)  ondansetron (ZOFRAN) injection 4 mg (4 mg Intravenous Given 08/31/16 2044)     Initial Impression / Assessment and Plan / ED Course  I have reviewed the triage vital signs and the nursing notes.  Pertinent labs & imaging results that were available during my care of the patient were reviewed by me and considered in my medical decision making (see chart for details).     21 year old female who presents with nausea and vomiting x 3 days. Initial vital signs show that patient is febrile 105.6. She is hypotensive and tachycardic. Initial labs including CBC, CMP, UA, i-STAT beta, i-STAT lactic acid ordered. Blood cultures 2 ordered. IVF given for fluid resuscitation. Patient given antipyretics and antiemetics for symptomatic treatment.  Review of records show that patient was treated for GC chlamydia on 08/25/16. She states she is not given any antiemetics to go home with. View of records show that patient was negative for gonorrhea and chlamydia.  Initial i-STAT beta positive. Will order hCG Quant for further evaluation. Initial lactic acid is 1.99. CBC shows elevated white count at 12.7 CMP shows slight bump in creatinine. Bedside ultrasound done by Dr. Fayrene FearingJames. No evidence of intra-abdominal fluid. No evidence of ectopic pregnancy. Urine is pending at this time   Code sepsis initiated. Unclear source at this point. Given pending hCG Quant result, we will hold on antibiotics. Additional IVF ordered.  Beta Quant returned and is negative. Will order broad spectrum antibiotics. Additional Zofran given.  Patient signed out to Earley FavorGail Schulz with CXR and urine pending. Will likely require admission for sepsis.   CRITICAL CARE Performed by: Maxwell CaulLindsey A Wyvonne Carda   Total critical care time: 30 minutes  Critical care time was exclusive of separately billable procedures and treating other patients.  Critical care was necessary to treat or prevent imminent or  life-threatening deterioration.  Critical care was time spent personally by me on the following activities: development of treatment plan with patient and/or surrogate as well as nursing, discussions with consultants, evaluation of patient's response to treatment, examination of patient, obtaining history from patient or surrogate, ordering and performing treatments and interventions, ordering and review of laboratory studies, ordering and review of radiographic studies, pulse oximetry and re-evaluation of patient's condition.  c   Final Clinical Impressions(s) / ED Diagnoses   Final diagnoses:  Sepsis, due to unspecified organism [facility])    New Prescriptions New Prescriptions   No medications on file     Rosana Hoes 08/31/16 2311    Rolland Porter, MD 09/06/16 1517

## 2016-08-31 NOTE — ED Triage Notes (Signed)
[facility]- pt brought in by EMS with n/v/d. Fever and chills. Tachy with EMS. Pt reports she hasn't eaten in days. Fever of 105.6 here.

## 2016-08-31 NOTE — ED Provider Notes (Signed)
Patient was reevaluated.  She remains tachycardic and febrile.  Urine was reviewed.  It shows that she is spilling ketones, so she still moderately dehydrated.  Additional IV fluids have been ordered.  I will ask the hospitalist to admit patient for sepsis with unknown source. At this time, I do not feel any further diagnostic tests are needed He has been given antibiotics.  Will await results of blood cultures and urine culture   Earley FavorSchulz, Justus Droke, NP 08/31/16 2240    Rolland PorterJames, Mark, MD 09/06/16 [phone]

## 2016-08-31 NOTE — Progress Notes (Addendum)
Pharmacy Antibiotic Note  Linda Nunez is a [age] y.o. female admitted on 6/11/2018with suspected sepsis. Pharmacy has been consulted for vancomycin and zosyn dosing. Tmax 105.6, WBC elevated at 12.7, and LA elevated at 2. SCr 1.19 with estimated CrCl ~ 80 mL/min.   Plan: Vancomycin 1g IV x1 per MD, then 750 mg IV q8hr  Zosyn 3.375g IV q8hr Vancomycin goal trough 15-20 mcg/mL [facility] and as needed Monitor renal function, clinical picture, and culture data F/u length of therapy   Temp (24hrs), Avg:104 F (40 C), Min:102.4 F (39.1 C), Max:105.6 F (40.9 C)   Recent Labs Lab 08/31/16 1830 08/31/16 1846 08/31/16 2016  WBC 12.7*  --   --   CREATININE 1.19*  --   --   LATICACIDVEN  --  1.99* 1.10    CrCl cannot be calculated (Unknown ideal weight.).    No Known Allergies  Antimicrobials this admission: 6/11 Vanc >>  6/11 Zosyn >>   Dose adjustments this admission: n/a   Microbiology results: pending   York CeriseKatherine Cook, PharmD Pharmacy Resident  Pager [phone] 08/31/16 9:15 PM

## 2016-09-01 DIAGNOSIS — A419 Sepsis, unspecified organism: Secondary | ICD-10-CM | POA: Diagnosis present

## 2016-09-01 DIAGNOSIS — R509 Fever, unspecified: Secondary | ICD-10-CM

## 2016-09-01 DIAGNOSIS — J189 Pneumonia, unspecified organism: Secondary | ICD-10-CM

## 2016-09-01 DIAGNOSIS — R000 Tachycardia, unspecified: Secondary | ICD-10-CM

## 2016-09-01 DIAGNOSIS — R112 Nausea with vomiting, unspecified: Secondary | ICD-10-CM

## 2016-09-01 LAB — PREGNANCY, URINE: PREG TEST UR: NEGATIVE

## 2016-09-01 LAB — BASIC METABOLIC PANEL
Anion gap: 8 (ref 5–15)
BUN: 5 mg/dL — AB (ref 6–20)
CHLORIDE: 106 mmol/L (ref 101–111)
CO2: 21 mmol/L — AB (ref 22–32)
Calcium: 7.9 mg/dL — ABNORMAL LOW (ref 8.9–10.3)
Creatinine, Ser: 0.94 mg/dL (ref 0.44–1.00)
GFR calc Af Amer: >60 mL/min (ref >60)
GFR calc non Af Amer: >60 mL/min (ref >60)
Glucose, Bld: 110 mg/dL — ABNORMAL HIGH (ref 65–99)
Potassium: 3.5 mmol/L (ref 3.5–5.1)
Sodium: 135 mmol/L (ref 135–145)

## 2016-09-01 LAB — GC/CHLAMYDIA PROBE AMP (~~LOC~~) NOT AT ARMC
CHLAMYDIA, DNA PROBE: NEGATIVE
NEISSERIA GONORRHEA: NEGATIVE

## 2016-09-01 LAB — INFLUENZA PANEL BY PCR (TYPE A & B)
Influenza A By PCR: NEGATIVE
Influenza B By PCR: NEGATIVE

## 2016-09-01 LAB — STREP PNEUMONIAE URINARY ANTIGEN: Strep Pneumo Urinary Antigen: NEGATIVE

## 2016-09-01 LAB — PROCALCITONIN: PROCALCITONIN: 2.27 ng/mL

## 2016-09-01 LAB — MRSA PCR SCREENING: MRSA by PCR: NEGATIVE

## 2016-09-01 MED ORDER — DEXTROSE 5 % IV SOLN
500.0000 mg | INTRAVENOUS | Status: DC
  Administered 2016-09-02 (×2): 500 mg via INTRAVENOUS
  Filled 2016-09-01 (×2): qty 500

## 2016-09-01 MED ORDER — ENOXAPARIN SODIUM 40 MG/0.4ML ~~LOC~~ SOLN
40.0000 mg | Freq: Every day | SUBCUTANEOUS | Status: DC
  Administered 2016-09-01 – 2016-09-02 (×2): 40 mg via SUBCUTANEOUS
  Filled 2016-09-01 (×2): qty 0.4

## 2016-09-01 MED ORDER — DEXTROSE 5 % IV SOLN
1.0000 g | INTRAVENOUS | Status: DC
  Administered 2016-09-01 – 2016-09-02 (×2): 1 g via INTRAVENOUS
  Filled 2016-09-01 (×2): qty 10

## 2016-09-01 MED ORDER — HYOSCYAMINE SULFATE 0.125 MG SL SUBL
0.2500 mg | SUBLINGUAL_TABLET | Freq: Once | SUBLINGUAL | Status: AC
Start: 2016-09-01 — End: 2016-09-01
  Administered 2016-09-01: 0.25 mg via SUBLINGUAL
  Filled 2016-09-01: qty 2

## 2016-09-01 MED ORDER — ONDANSETRON HCL 4 MG/2ML IJ SOLN
4.0000 mg | Freq: Four times a day (QID) | INTRAMUSCULAR | Status: DC | PRN
  Administered 2016-09-01 (×2): 4 mg via INTRAVENOUS
  Filled 2016-09-01: qty 2

## 2016-09-01 MED ORDER — SODIUM CHLORIDE 0.9 % IV SOLN
INTRAVENOUS | Status: DC
  Administered 2016-09-01 – 2016-09-02 (×2): via INTRAVENOUS

## 2016-09-01 MED ORDER — ACETAMINOPHEN 325 MG PO TABS
650.0000 mg | ORAL_TABLET | Freq: Four times a day (QID) | ORAL | Status: DC | PRN
  Administered 2016-09-01: 650 mg via ORAL
  Filled 2016-09-01 (×2): qty 2

## 2016-09-01 NOTE — H&P (Signed)
History and Physical    Linda Nunez:[id number] DOB: [birth year] DOA: 08/31/2016  PCP: Patient, No Pcp Per   Patient coming from: Home  Chief Complaint: Cough, chills, nausea, and vomiting  HPI: Linda Nunez is a 21 y.o. woman with a history of chlamydia infection who presents to the ED for evaluation of nausea and vomiting, cough, chills, and sweats since Saturday.  She denies any known sick contacts or new exposures.  She chest soreness from coughing.  She feels congestion, but her cough has been nonproductive to this point.  No documented fever at home.  No lower urinary tract symptoms.  She denies abdominal or pelvic pain.  She has had diarrhea, described as three loose stools in the past 24 hours.  No blood in her urine or stool.  Stools have not been watery.  She is sexually active and admits that she does not use condoms consistently.  She tells me that an HIV test was [facility] within in the past 30 days.  ED Course: Oral temp of 105.6 recorded in the ED.  Repeat rectal temp of 102.  She had a sinus tachycardia with HR in the 130's that has resolved with IV fluid resuscitation.  Code Sepsis called and she has received 30cc/kg of NS; then a maintenance rate of 150cc/hr was initiated in the ED.  She received vanc and zosyn initially, but repeat chest xray concerning for lingula and left lower lobe infiltrates.  Antibiotic coverage changed to Rocephin and azithromycin for CAP.  WBC count 12.7.  Sodium 133.  BUN 5.  Creatinine 1.19.  Urine pregnancy test negative.  Hospitalist asked to admit.  Review of Systems: As per HPI otherwise 10 systems reviewed and negative.   Past Medical History:  Diagnosis Date  . Chlamydia     History reviewed. No pertinent surgical history.  She denies any major surgeries.   reports that she is a non-smoker but has been exposed to tobacco smoke. She has never used smokeless tobacco. She reports that she drinks alcohol.  She reports that she uses drugs, including Marijuana.  She is not married.  She does not have any children.  Allergies  Allergen Reactions  . Latex Itching and Rash    Family History  Problem Relation Age of Onset  . Asthma Mother   . Diabetes Father      Prior to Admission medications   Medication Sig Start Date End Date Taking? Authorizing Provider  acetaminophen (TYLENOL) 500 MG tablet Take 500-1,000 mg by mouth every 6 (six) hours as needed for mild pain, fever or headache.    Yes [provider]  ibuprofen (ADVIL,MOTRIN) 200 MG tablet Take 200-400 mg by mouth every 6 (six) hours as needed for fever, headache or mild pain.   Yes [provider]  butalbital-acetaminophen-caffeine (FIORICET, ESGIC) 50-325-40 MG tablet Take 1-2 tablets by mouth every 8 (eight) hours as needed for headache. Patient not taking: Reported on 08/31/2016 04/01/16 04/01/17  Antony Madura, PA-C    Physical Exam: Vitals:   08/31/16 2345 09/01/16 0000 09/01/16 0100 09/01/16 0115  BP: (!) 101/58 125/62 100/72 106/71  Pulse: (!) 101 (!) 108 99 98  Resp: (!) 21 17 18 16   Temp:      TempSrc:      SpO2: 97% 99% 98% 97%  Weight:          Constitutional: NAD but ill appearing.  She is calm. Vitals:   08/31/16 2345 09/01/16 0000  09/01/16 0100 09/01/16 0115  BP: (!) 101/58 125/62 100/72 106/71  Pulse: (!) 101 (!) 108 99 98  Resp: (!) 21 17 18 16   Temp:      TempSrc:      SpO2: 97% 99% 98% 97%  Weight:       Eyes: PERRL, lids and conjunctivae normal ENMT: Mucous membranes are dry. Posterior pharynx clear of any exudate or lesions. Normal dentition.  Neck: normal appearance, supple, no masses Respiratory: Diminished bilaterally with distant breath sounds.  No wheezing, no crackles. Normal respiratory effort. No accessory muscle use.  Cardiovascular: Mildly tachycardic but regular.  No murmurs / rubs / gallops. No extremity edema. 2+ pedal pulses. GI: abdomen is soft and compressible.   No distention.  No tenderness.  No masses palpated.  Bowel sounds are present. Musculoskeletal:  No joint deformity in upper and lower extremities. Good ROM, no contractures. Normal muscle tone.  Skin: no rashes, warm and dry Neurologic: No focal deficits. Psychiatric: Normal judgment and insight. Alert and oriented x 3. Normal mood.     Labs on Admission: I have personally reviewed following labs and imaging studies  CBC:  Recent Labs Lab 08/31/16 1830  WBC 12.7*  HGB 12.3  HCT 35.8*  MCV 87.3  PLT 283   Basic Metabolic Panel:  Recent Labs Lab 08/31/16 1830  NA 133*  K 3.6  CL 96*  CO2 23  GLUCOSE 105*  BUN 5*  CREATININE 1.19*  CALCIUM 9.3   GFR: Estimated Creatinine Clearance: 74.6 mL/min (A) (by C-G formula based on SCr of 1.19 mg/dL (H)). Liver Function Tests:  Recent Labs Lab 08/31/16 1830  AST 46*  ALT 32  ALKPHOS 88  BILITOT 1.1  PROT 8.3*  ALBUMIN 3.8    Recent Labs Lab 08/31/16 1830  LIPASE 17   Urine analysis:    Component Value Date/Time   COLORURINE YELLOW 08/31/2016 2110   APPEARANCEUR HAZY (A) 08/31/2016 2110   LABSPEC 1.031 (H) 08/31/2016 2110   PHURINE 5.0 08/31/2016 2110   GLUCOSEU NEGATIVE 08/31/2016 2110   HGBUR NEGATIVE 08/31/2016 2110   BILIRUBINUR NEGATIVE 08/31/2016 2110   KETONESUR 80 (A) 08/31/2016 2110   PROTEINUR 30 (A) 08/31/2016 2110   UROBILINOGEN 1.0 03/11/2016 1146   NITRITE NEGATIVE 08/31/2016 2110   LEUKOCYTESUR NEGATIVE 08/31/2016 2110   Sepsis Labs:  Lactic acid level 1.99, repeat 1.10  Radiological Exams on Admission: Dg Chest 1 View  Result Date: 08/31/2016 CLINICAL DATA:  Nausea vomiting diarrhea fever and chills EXAM: CHEST 1 VIEW COMPARISON:  06/11/ 2018 FINDINGS: Streaky infiltrate overlying lingula and left base. No pleural effusion. Thoracic alignment within normal limits. IMPRESSION: Streaky infiltrates overlying the lingula and left lung base. No pleural effusion. Electronically Signed   By:  Jasmine Pang M.D.   On: 08/31/2016 23:15   Dg Chest Port 1 View  Result Date: 08/31/2016 CLINICAL DATA:  Sepsis, pt brought in by EMS with n/v/d. Fever and chills. Tachy with EMS. Pt reports she hasn't eaten in days. Fever of 105.6 here. EXAM: PORTABLE CHEST 1 VIEW COMPARISON:  None. FINDINGS: Faint retrocardiac density on the left, uncertain significance. The lungs appear otherwise clear. Cardiac and mediastinal margins appear normal. The patient is rotated to the right on today's radiograph, reducing diagnostic sensitivity and specificity. IMPRESSION: 1. Faint left retrocardiac density could reflect atelectasis or pneumonia. If feasible, lateral projection may help in further characterization. Electronically Signed   By: Gaylyn Rong M.D.   On: 08/31/2016 20:57  Assessment/Plan Principal Problem:   Sepsis (HCC) Active Problems:   CAP (community acquired pneumonia)   Nausea and vomiting   Sinus tachycardia   Fever      Sepsis secondary to CAP --Continue Rocephin and azithromycin --Turn, cough, IS q2h --Supplemental oxygen as needed --continue NS at 75cc/hr --Check procalcitonin level --Blood and urine cultures pending --Can alternate acetaminophen and NSAID as needed for fever. --Check urine streptococcal antigen  Nausea and vomiting with diarrhea though abdominal exam is benign --Anti-emetics as needed --Hydrate --Clear liquids as tolerated for now  Sinus tachycardia --telemetry monitoring --Almost resolved with hydration   DVT prophylaxis: Lovenox Code Status: FULL Family Communication: Patient alone in the ED at time of admission. Disposition Plan: Expect she will go home at discharge. Consults called: NONE Admission status: Inpatient, telemetry monitoring.   TIME SPENT: 60 minutes   Jerene Bearsarter,Zoanne Newill Harrison MD [facility] Pager [phone]  If 7PM-7AM, please contact night-coverage [email or web address] Password [email or web address]  09/01/2016, 2:49 AM

## 2016-09-01 NOTE — Progress Notes (Signed)
PROGRESS NOTE                                                                                                                                                                                                             Patient Demographics:    Linda Nunez, is a 21 y.o. female, DOB - 13-Feb-1996, BJY:[id number]  Admit date - 08/31/2016   Admitting Physician Michael Litter, MD  Outpatient Primary MD for the patient is Patient, No Pcp Per  LOS - 0   Chief Complaint  Patient presents with  . Nausea  . Emesis       Brief Narrative   This is a no charge note for patient admitted earlier by Dr. Montez Morita , chart, imaging and labs were reviewed.   21 y.o. woman with a history of chlamydia infection who presents to the ED for evaluation of nausea and vomiting, cough, chills, and sweats since Saturday, Significant with fever of 105.6, minutes for CAP treatment.   Subjective:    Linda Nunez today has, No headache, No chest pain,Reports cough, productive, report generalized weakness,   Assessment  & Plan :    Principal Problem:   Sepsis (HCC) Active Problems:   CAP (community acquired pneumonia)   Nausea and vomiting   Sinus tachycardia   Fever   Sepsis secondary to CAP --Continue Rocephin and azithromycin --Turn, cough, IS q2h --Supplemental oxygen as needed --continue NS at 75cc/hr --Check procalcitonin level --Blood and urine cultures pending --Can alternate acetaminophen and NSAID as needed for fever. --Check urine streptococcal antigen  Nausea and vomiting with diarrhea though abdominal exam is benign --Anti-emetics as needed --Hydrate --Clear liquids as tolerated for now  Sinus tachycardia --telemetry monitoring --Almost resolved with hydration     Code Status : Full  Family Communication  : None at bedside  Disposition Plan  : Home when stable  Consults  :  None  Procedures  : None  DVT Prophylaxis  :  Lovenox    Lab Results  Component Value Date   PLT 283 08/31/2016    Antibiotics  :    Anti-infectives    Start     Dose/Rate Route Frequency Ordered Stop   09/01/16 2200  cefTRIAXone (ROCEPHIN) 1 g in dextrose 5 % 50 mL IVPB     1 g 100 mL/hr over 30 Minutes  Intravenous Every 24 hours 09/01/16 0248 09/08/16 2159   09/01/16 2200  azithromycin (ZITHROMAX) 500 mg in dextrose 5 % 250 mL IVPB     500 mg 250 mL/hr over 60 Minutes Intravenous Every 24 hours 09/01/16 0248 09/08/16 2159   09/01/16 0500  piperacillin-tazobactam (ZOSYN) IVPB 3.375 g  Status:  Discontinued     3.375 g 12.5 mL/hr over 240 Minutes Intravenous Every 8 hours 08/31/16 2135 09/01/16 0248   09/01/16 0300  vancomycin (VANCOCIN) IVPB 750 mg/150 ml premix  Status:  Discontinued     750 mg 150 mL/hr over 60 Minutes Intravenous Every 8 hours 08/31/16 2135 09/01/16 0248   08/31/16 2345  cefTRIAXone (ROCEPHIN) 2 g in dextrose 5 % 50 mL IVPB     2 g 100 mL/hr over 30 Minutes Intravenous  Once 08/31/16 2339 09/01/16 0152   08/31/16 2345  azithromycin (ZITHROMAX) tablet 500 mg     500 mg Oral  Once 08/31/16 2339 08/31/16 2357   08/31/16 2045  piperacillin-tazobactam (ZOSYN) IVPB 3.375 g     3.375 g 100 mL/hr over 30 Minutes Intravenous  Once 08/31/16 2034 08/31/16 2114   08/31/16 2045  vancomycin (VANCOCIN) IVPB 1000 mg/200 mL premix     1,000 mg 200 mL/hr over 60 Minutes Intravenous  Once 08/31/16 2034 09/01/16 0152        Objective:   Vitals:   09/01/16 0130 09/01/16 0221 09/01/16 0436 09/01/16 0856  BP: (!) 91/46  122/70 (!) 112/58  Pulse: 94  (!) 112 (!) 115  Resp: 18  16 16   Temp: 99.7 F (37.6 C)  98 F (36.7 C) 99.1 F (37.3 C)  TempSrc: Oral  Oral Oral  SpO2: 99%   99%  Weight:      Height:  5\' 7"  (1.702 m)      Wt Readings from Last 3 Encounters:  08/31/16 80.7 kg (178 lb)  04/09/16 81.6 kg (180 lb)  07/29/15 89.1 kg (196 lb 6.4 oz)     Intake/Output Summary (Last 24 hours) at 09/01/16 1456 Last  data filed at 09/01/16 1334  Gross per 24 hour  Intake          3713.75 ml  Output              600 ml  Net          3113.75 ml     Physical Exam  Awake Alert, Oriented X 3, Symmetrical Chest wall movement, Good air movement bilaterally, CTAB RRR,No Gallops,Rubs or new Murmurs,  +ve B.Sounds, Abd Soft, No tenderness,  No Cyanosis, Clubbing or edema,     Data Review:    CBC  Recent Labs Lab 08/31/16 1830  WBC 12.7*  HGB 12.3  HCT 35.8*  PLT 283  MCV 87.3  MCH 30.0  MCHC 34.4  RDW 12.8    Chemistries   Recent Labs Lab 08/31/16 1830 09/01/16 0557  NA 133* 135  K 3.6 3.5  CL 96* 106  CO2 23 21*  GLUCOSE 105* 110*  BUN 5* 5*  CREATININE 1.19* 0.94  CALCIUM 9.3 7.9*  AST 46*  --   ALT 32  --   ALKPHOS 88  --   BILITOT 1.1  --    ------------------------------------------------------------------------------------------------------------------ No results for input(s): CHOL, HDL, LDLCALC, TRIG, CHOLHDL, LDLDIRECT in the last 72 hours.  No results found for: HGBA1C ------------------------------------------------------------------------------------------------------------------ No results for input(s): TSH, T4TOTAL, T3FREE, THYROIDAB in the last 72 hours.  Invalid input(s): FREET3 ------------------------------------------------------------------------------------------------------------------ No results  for input(s): VITAMINB12, FOLATE, FERRITIN, TIBC, IRON, RETICCTPCT in the last 72 hours.  Coagulation profile No results for input(s): INR, PROTIME in the last 168 hours.  No results for input(s): DDIMER in the last 72 hours.  Cardiac Enzymes No results for input(s): CKMB, TROPONINI, MYOGLOBIN in the last 168 hours.  Invalid input(s): CK ------------------------------------------------------------------------------------------------------------------ No results found for: BNP  Inpatient Medications  Scheduled Meds: . enoxaparin (LOVENOX) injection   40 mg Subcutaneous Daily   Continuous Infusions: . sodium chloride 75 mL/hr at 09/01/16 0307  . azithromycin    . cefTRIAXone (ROCEPHIN)  IV     PRN Meds:.acetaminophen, ondansetron (ZOFRAN) IV  Micro Results Recent Results (from the past 240 hour(s))  Wet prep, genital     Status: Abnormal   Collection Time: 08/31/16  9:28 PM  Result Value Ref Range Status   Yeast Wet Prep HPF POC PRESENT (A) NONE SEEN Final   Trich, Wet Prep NONE SEEN NONE SEEN Final   Clue Cells Wet Prep HPF POC NONE SEEN NONE SEEN Final   WBC, Wet Prep HPF POC MODERATE (A) NONE SEEN Final   Sperm NONE SEEN  Final  MRSA PCR Screening     Status: None   Collection Time: 09/01/16  3:29 AM  Result Value Ref Range Status   MRSA by PCR NEGATIVE NEGATIVE Final    Comment:        The GeneXpert MRSA Assay (FDA approved for NASAL specimens only), is one component of a comprehensive MRSA colonization surveillance program. It is not intended to diagnose MRSA infection nor to guide or monitor treatment for MRSA infections.     Radiology Reports Dg Chest 1 View  Result Date: 08/31/2016 CLINICAL DATA:  Nausea vomiting diarrhea fever and chills EXAM: CHEST 1 VIEW COMPARISON:  06/11/ 2018 FINDINGS: Streaky infiltrate overlying lingula and left base. No pleural effusion. Thoracic alignment within normal limits. IMPRESSION: Streaky infiltrates overlying the lingula and left lung base. No pleural effusion. Electronically Signed   By: Jasmine PangKim  Fujinaga M.D.   On: 08/31/2016 23:15   Dg Chest Port 1 View  Result Date: 08/31/2016 CLINICAL DATA:  Sepsis, pt brought in by EMS with n/v/d. Fever and chills. Tachy with EMS. Pt reports she hasn't eaten in days. Fever of 105.6 here. EXAM: PORTABLE CHEST 1 VIEW COMPARISON:  None. FINDINGS: Faint retrocardiac density on the left, uncertain significance. The lungs appear otherwise clear. Cardiac and mediastinal margins appear normal. The patient is rotated to the right on today's  radiograph, reducing diagnostic sensitivity and specificity. IMPRESSION: 1. Faint left retrocardiac density could reflect atelectasis or pneumonia. If feasible, lateral projection may help in further characterization. Electronically Signed   By: Gaylyn RongWalter  Liebkemann M.D.   On: 08/31/2016 20:57    Time Spent in minutes : No charge   Randol KernELGERGAWY, Danzell Birky M.D on 09/01/2016 at 2:56 PM  Between 7am to 7pm - Pager - [phone]  After 7pm go to [email or web address] - password [email or web address]  [facility] -  Office  [phone]

## 2016-09-02 ENCOUNTER — Inpatient Hospital Stay (HOSPITAL_COMMUNITY): Payer: Self-pay

## 2016-09-02 DIAGNOSIS — J181 Lobar pneumonia, unspecified organism: Secondary | ICD-10-CM

## 2016-09-02 DIAGNOSIS — G43A1 Cyclical vomiting, intractable: Secondary | ICD-10-CM

## 2016-09-02 DIAGNOSIS — A419 Sepsis, unspecified organism: Principal | ICD-10-CM

## 2016-09-02 DIAGNOSIS — R000 Tachycardia, unspecified: Secondary | ICD-10-CM

## 2016-09-02 LAB — LEGIONELLA PNEUMOPHILA SEROGP 1 UR AG: L. pneumophila Serogp 1 Ur Ag: NEGATIVE

## 2016-09-02 LAB — BASIC METABOLIC PANEL
ANION GAP: 9 (ref 5–15)
CHLORIDE: 101 mmol/L (ref 101–111)
CO2: 22 mmol/L (ref 22–32)
Calcium: 7.9 mg/dL — ABNORMAL LOW (ref 8.9–10.3)
Creatinine, Ser: 0.94 mg/dL (ref 0.44–1.00)
GFR calc Af Amer: >60 mL/min (ref >60)
GFR calc non Af Amer: >60 mL/min (ref >60)
GLUCOSE: 94 mg/dL (ref 65–99)
POTASSIUM: 3.3 mmol/L — AB (ref 3.5–5.1)
Sodium: 132 mmol/L — ABNORMAL LOW (ref 135–145)

## 2016-09-02 LAB — URINE CULTURE: CULTURE: NO GROWTH

## 2016-09-02 LAB — CBC
HCT: 29.3 % — ABNORMAL LOW (ref 36.0–46.0)
HEMOGLOBIN: 9.7 g/dL — AB (ref 12.0–15.0)
MCH: 28.7 pg (ref 26.0–34.0)
MCHC: 33.1 g/dL (ref 30.0–36.0)
MCV: 86.7 fL (ref 78.0–100.0)
Platelets: 277 10*3/uL (ref 150–400)
RBC: 3.38 MIL/uL — AB (ref 3.87–5.11)
RDW: 13.1 % (ref 11.5–15.5)
WBC: 12 10*3/uL — AB (ref 4.0–10.5)

## 2016-09-02 LAB — BRAIN NATRIURETIC PEPTIDE: B NATRIURETIC PEPTIDE 5: 146.4 pg/mL — AB (ref 0.0–100.0)

## 2016-09-02 MED ORDER — HYDROCOD POLST-CPM POLST ER 10-8 MG/5ML PO SUER: 5.0000 mL | Freq: Two times a day (BID) | ORAL | Status: DC | PRN

## 2016-09-02 MED ORDER — FLUCONAZOLE 100 MG PO TABS
100.0000 mg | ORAL_TABLET | Freq: Once | ORAL | Status: AC
  Administered 2016-09-02: 100 mg via ORAL
  Filled 2016-09-02: qty 1

## 2016-09-02 MED ORDER — ACETAMINOPHEN 650 MG RE SUPP
650.0000 mg | RECTAL | Status: DC | PRN
  Administered 2016-09-02: 650 mg via RECTAL
  Filled 2016-09-02: qty 1

## 2016-09-02 MED ORDER — POTASSIUM CHLORIDE IN NACL 40-0.9 MEQ/L-% IV SOLN
INTRAVENOUS | Status: DC
  Administered 2016-09-02: 75 mL/h via INTRAVENOUS
  Filled 2016-09-02 (×2): qty 1000

## 2016-09-02 MED ORDER — FUROSEMIDE 10 MG/ML IJ SOLN
40.0000 mg | Freq: Once | INTRAMUSCULAR | Status: AC
  Administered 2016-09-02: 40 mg via INTRAVENOUS
  Filled 2016-09-02: qty 4

## 2016-09-02 MED ORDER — SODIUM CHLORIDE 0.9 % IV BOLUS (SEPSIS)
500.0000 mL | Freq: Once | INTRAVENOUS | Status: AC
  Administered 2016-09-02: 500 mL via INTRAVENOUS

## 2016-09-02 MED ORDER — POTASSIUM CHLORIDE 10 MEQ/100ML IV SOLN: 10.0000 meq | INTRAVENOUS | Status: DC

## 2016-09-02 MED ORDER — POTASSIUM CHLORIDE CRYS ER 20 MEQ PO TBCR
40.0000 meq | EXTENDED_RELEASE_TABLET | Freq: Once | ORAL | Status: AC
  Administered 2016-09-02: 40 meq via ORAL
  Filled 2016-09-02: qty 2

## 2016-09-02 NOTE — Progress Notes (Signed)
PROGRESS NOTE                                                                                                                                                                                                             Patient Demographics:    Linda Nunez, is a 21 y.o. female, DOB - 02-23-1996, WUJ:[id number]  Admit date - 08/31/2016   Admitting Physician Michael LitterNikki Carter, MD  Outpatient Primary MD for the patient is Patient, No Pcp Per  LOS - 1   Chief Complaint  Patient presents with  . Nausea  . Emesis       Brief Narrative   This is a no charge note for patient admitted earlier by Dr. Montez Moritaarter , chart, imaging and labs were reviewed.   21 y.o. woman with a history of chlamydia infection who presents to the ED for evaluation of nausea and vomiting, cough, chills, and sweats since Saturday, Significant with fever of 105.6, minutes for CAP treatment.   Subjective:   Patient in bed, has mild cough and some bilateral chest pain in the ribs with cough, no shortness of breath, no weakness in arms or legs, no dysuria or abdominal pain at this time.   Assessment  & Plan :     Sepsis secondary to CAP --Continue Rocephin and is to mycin, encouraged to sit up in the chair she has been not out of bed since admission, continue oxygen and nebulizer treatments as needed when necessary, cultures so far negative. Aditus and X4 cough relief as needed, she does have mild pleuritic chest pain from excessive cough for which we place her on some Motrin as needed. Clinically sepsis physiology seems to have resolved.  Rales on exam. Chest x-ray looks like interstitial pattern, could have mild fluid overload, stop IV fluids gentle Lasix and monitor.  Nausea and vomiting with diarrhea though abdominal exam is benign Appears comfortable, placed on Zofran when necessary along with as needed Phenergan. Abdominal exam remains benign, last bowel movement was  09/01/2016.  Sinus tachycardia Due to fevers resolved, supportive care.     Code Status : Full  Family Communication  : None at bedside  Disposition Plan  : Home when stable  Consults  :  None  Procedures  : None  DVT Prophylaxis  :  Lovenox   Lab Results  Component  Value Date   PLT 277 09/02/2016    Antibiotics  :    Anti-infectives    Start     Dose/Rate Route Frequency Ordered Stop   09/02/16 0745  fluconazole (DIFLUCAN) tablet 100 mg     100 mg Oral  Once 09/02/16 0733 09/02/16 0845   09/01/16 2200  cefTRIAXone (ROCEPHIN) 1 g in dextrose 5 % 50 mL IVPB     1 g 100 mL/hr over 30 Minutes Intravenous Every 24 hours 09/01/16 0248 09/08/16 2159   09/01/16 2200  azithromycin (ZITHROMAX) 500 mg in dextrose 5 % 250 mL IVPB     500 mg 250 mL/hr over 60 Minutes Intravenous Every 24 hours 09/01/16 0248 09/08/16 2159   09/01/16 0500  piperacillin-tazobactam (ZOSYN) IVPB 3.375 g  Status:  Discontinued     3.375 g 12.5 mL/hr over 240 Minutes Intravenous Every 8 hours 08/31/16 2135 09/01/16 0248   09/01/16 0300  vancomycin (VANCOCIN) IVPB 750 mg/150 ml premix  Status:  Discontinued     750 mg 150 mL/hr over 60 Minutes Intravenous Every 8 hours 08/31/16 2135 09/01/16 0248   08/31/16 2345  cefTRIAXone (ROCEPHIN) 2 g in dextrose 5 % 50 mL IVPB     2 g 100 mL/hr over 30 Minutes Intravenous  Once 08/31/16 2339 09/01/16 0152   08/31/16 2345  azithromycin (ZITHROMAX) tablet 500 mg     500 mg Oral  Once 08/31/16 2339 08/31/16 2357   08/31/16 2045  piperacillin-tazobactam (ZOSYN) IVPB 3.375 g     3.375 g 100 mL/hr over 30 Minutes Intravenous  Once 08/31/16 2034 08/31/16 2114   08/31/16 2045  vancomycin (VANCOCIN) IVPB 1000 mg/200 mL premix     1,000 mg 200 mL/hr over 60 Minutes Intravenous  Once 08/31/16 2034 09/01/16 0152        Objective:   Vitals:   09/01/16 1818 09/01/16 2104 09/02/16 0440 09/02/16 0723  BP: 121/64 110/71 (!) 107/56   Pulse: 100 89 72   Resp: 18 18 18     Temp: 98.4 F (36.9 C) 100 F (37.8 C) (!) 102.7 F (39.3 C) (!) 100.5 F (38.1 C)  TempSrc: Oral Oral Oral   SpO2: 99% 100% 95%   Weight:      Height:        Wt Readings from Last 3 Encounters:  08/31/16 80.7 kg (178 lb)  04/09/16 81.6 kg (180 lb)  07/29/15 89.1 kg (196 lb 6.4 oz)     Intake/Output Summary (Last 24 hours) at 09/02/16 1007 Last data filed at 09/02/16 0441  Gross per 24 hour  Intake          1900.01 ml  Output             1250 ml  Net           650.01 ml     Physical Exam  Awake Alert, Oriented X 3, No new F.N deficits, Normal affect [place].AT,PERRAL Supple Neck,No JVD, No cervical lymphadenopathy appriciated.  Symmetrical Chest wall movement, Good air movement bilaterally, few rales RRR,No Gallops,Rubs or new Murmurs, No Parasternal Heave +ve B.Sounds, Abd Soft, No tenderness, No organomegaly appriciated, No rebound - guarding or rigidity. No Cyanosis, Clubbing or edema, No new Rash or bruise    Data Review:    CBC  Recent Labs Lab 08/31/16 1830 09/02/16 0441  WBC 12.7* 12.0*  HGB 12.3 9.7*  HCT 35.8* 29.3*  PLT 283 277  MCV 87.3 86.7  MCH 30.0 28.7  MCHC 34.4  33.1  RDW 12.8 13.1    Chemistries   Recent Labs Lab 08/31/16 1830 09/01/16 0557 09/02/16 0441  NA 133* 135 132*  K 3.6 3.5 3.3*  CL 96* 106 101  CO2 23 21* 22  GLUCOSE 105* 110* 94  BUN 5* 5* <5*  CREATININE 1.19* 0.94 0.94  CALCIUM 9.3 7.9* 7.9*  AST 46*  --   --   ALT 32  --   --   ALKPHOS 88  --   --   BILITOT 1.1  --   --    ------------------------------------------------------------------------------------------------------------------ No results for input(s): CHOL, HDL, LDLCALC, TRIG, CHOLHDL, LDLDIRECT in the last 72 hours.  No results found for: HGBA1C ------------------------------------------------------------------------------------------------------------------ No results for input(s): TSH, T4TOTAL, T3FREE, THYROIDAB in the last 72 hours.  Invalid  input(s): FREET3 ------------------------------------------------------------------------------------------------------------------ No results for input(s): VITAMINB12, FOLATE, FERRITIN, TIBC, IRON, RETICCTPCT in the last 72 hours.  Coagulation profile No results for input(s): INR, PROTIME in the last 168 hours.  No results for input(s): DDIMER in the last 72 hours.  Cardiac Enzymes No results for input(s): CKMB, TROPONINI, MYOGLOBIN in the last 168 hours.  Invalid input(s): CK ------------------------------------------------------------------------------------------------------------------ No results found for: BNP  Inpatient Medications  Scheduled Meds: . enoxaparin (LOVENOX) injection  40 mg Subcutaneous Daily  . furosemide  40 mg Intravenous Once   Continuous Infusions: . azithromycin Stopped (09/02/16 0227)  . cefTRIAXone (ROCEPHIN)  IV Stopped (09/01/16 2306)  . potassium chloride     PRN Meds:.acetaminophen, acetaminophen, ondansetron (ZOFRAN) IV  Micro Results Recent Results (from the past 240 hour(s))  Blood culture (routine x 2)     Status: None (Preliminary result)   Collection Time: 08/31/16  6:31 PM  Result Value Ref Range Status   Specimen Description BLOOD RIGHT ANTECUBITAL  Final   Special Requests   Final    BOTTLES DRAWN AEROBIC AND ANAEROBIC Blood Culture adequate volume   Culture NO GROWTH < 24 HOURS  Final   Report Status PENDING  Incomplete  Blood culture (routine x 2)     Status: None (Preliminary result)   Collection Time: 08/31/16  6:43 PM  Result Value Ref Range Status   Specimen Description BLOOD LEFT HAND  Final   Special Requests IN PEDIATRIC BOTTLE Blood Culture adequate volume  Final   Culture NO GROWTH < 24 HOURS  Final   Report Status PENDING  Incomplete  Urine Culture     Status: None   Collection Time: 08/31/16  9:10 PM  Result Value Ref Range Status   Specimen Description URINE, CATHETERIZED  Final   Special Requests NONE  Final    Culture NO GROWTH  Final   Report Status 09/02/2016 FINAL  Final  Wet prep, genital     Status: Abnormal   Collection Time: 08/31/16  9:28 PM  Result Value Ref Range Status   Yeast Wet Prep HPF POC PRESENT (A) NONE SEEN Final   Trich, Wet Prep NONE SEEN NONE SEEN Final   Clue Cells Wet Prep HPF POC NONE SEEN NONE SEEN Final   WBC, Wet Prep HPF POC MODERATE (A) NONE SEEN Final   Sperm NONE SEEN  Final  MRSA PCR Screening     Status: None   Collection Time: 09/01/16  3:29 AM  Result Value Ref Range Status   MRSA by PCR NEGATIVE NEGATIVE Final    Comment:        The GeneXpert MRSA Assay (FDA approved for NASAL specimens only), is one component of a  comprehensive MRSA colonization surveillance program. It is not intended to diagnose MRSA infection nor to guide or monitor treatment for MRSA infections.     Radiology Reports Dg Chest 1 View  Result Date: 08/31/2016 CLINICAL DATA:  Nausea vomiting diarrhea fever and chills EXAM: CHEST 1 VIEW COMPARISON:  06/11/ 2018 FINDINGS: Streaky infiltrate overlying lingula and left base. No pleural effusion. Thoracic alignment within normal limits. IMPRESSION: Streaky infiltrates overlying the lingula and left lung base. No pleural effusion. Electronically Signed   By: Jasmine Pang M.D.   On: 08/31/2016 23:15   Dg Chest Port 1 View  Result Date: 09/02/2016 CLINICAL DATA:  Shortness of breath, community-acquired pneumonia, sepsis. EXAM: PORTABLE CHEST 1 VIEW COMPARISON:  Portable chest x-ray of August 31, 2016 FINDINGS: The lungs are well-expanded. The interstitial markings are increased bilaterally which is a marked change. The heart is top-normal in size. The pulmonary vascularity is not clearly engorged. The trachea is midline. There is no pleural effusion. The bony thorax is unremarkable. IMPRESSION: Marked increase in the interstitial markings since the study of 2 days ago which may reflect progression of interstitial pneumonia or more likely  interstitial edema. When the patient can tolerate the procedure, a PA and lateral chest x-ray would be useful. Electronically Signed   By: David  [place] M.D.   On: 09/02/2016 07:55   Dg Chest Port 1 View  Result Date: 08/31/2016 CLINICAL DATA:  Sepsis, pt brought in by EMS with n/v/d. Fever and chills. Tachy with EMS. Pt reports she hasn't eaten in days. Fever of 105.6 here. EXAM: PORTABLE CHEST 1 VIEW COMPARISON:  None. FINDINGS: Faint retrocardiac density on the left, uncertain significance. The lungs appear otherwise clear. Cardiac and mediastinal margins appear normal. The patient is rotated to the right on today's radiograph, reducing diagnostic sensitivity and specificity. IMPRESSION: 1. Faint left retrocardiac density could reflect atelectasis or pneumonia. If feasible, lateral projection may help in further characterization. Electronically Signed   By: Gaylyn Rong M.D.   On: 08/31/2016 20:57    Signature  Susa Raring M.D on 09/02/2016 at 10:07 AM  Between 7am to 7pm - Pager - [phone] ( page via [email or web address], text pages only, please mention full 10 digit call back number).  After 7pm go to [email or web address] - password [facility]

## 2016-09-02 NOTE — Progress Notes (Addendum)
Pt with temp of 102.7. Unable to take PO's due to emesis. Page to K. Schorr for notification. New order for 500 cc bolus and tylenol 650 mg suppository.  Dondra SpryMoore, Kjersten Ormiston Islee, RN

## 2016-09-03 LAB — CBC
HEMATOCRIT: 29.1 % — AB (ref 36.0–46.0)
HEMOGLOBIN: 9.7 g/dL — AB (ref 12.0–15.0)
MCH: 28.9 pg (ref 26.0–34.0)
MCHC: 33.3 g/dL (ref 30.0–36.0)
MCV: 86.6 fL (ref 78.0–100.0)
Platelets: 313 10*3/uL (ref 150–400)
RBC: 3.36 MIL/uL — ABNORMAL LOW (ref 3.87–5.11)
RDW: 13 % (ref 11.5–15.5)
WBC: 8.4 10*3/uL (ref 4.0–10.5)

## 2016-09-03 LAB — BASIC METABOLIC PANEL
ANION GAP: 10 (ref 5–15)
BUN: <5 mg/dL — ABNORMAL LOW (ref 6–20)
CALCIUM: 8.7 mg/dL — AB (ref 8.9–10.3)
CO2: 26 mmol/L (ref 22–32)
Chloride: 100 mmol/L — ABNORMAL LOW (ref 101–111)
Creatinine, Ser: 0.73 mg/dL (ref 0.44–1.00)
GLUCOSE: 92 mg/dL (ref 65–99)
POTASSIUM: 3.2 mmol/L — AB (ref 3.5–5.1)
Sodium: 136 mmol/L (ref 135–145)

## 2016-09-03 LAB — MAGNESIUM: MAGNESIUM: 2 mg/dL (ref 1.7–2.4)

## 2016-09-03 MED ORDER — POTASSIUM CHLORIDE ER 10 MEQ PO TBCR: 20.0000 meq | EXTENDED_RELEASE_TABLET | Freq: Two times a day (BID) | ORAL | 0 refills | Status: DC

## 2016-09-03 MED ORDER — LEVOFLOXACIN 750 MG PO TABS: 750.0000 mg | ORAL_TABLET | Freq: Every day | ORAL | 0 refills | Status: AC

## 2016-09-03 MED ORDER — POTASSIUM CHLORIDE CRYS ER 20 MEQ PO TBCR: 40.0000 meq | EXTENDED_RELEASE_TABLET | Freq: Once | ORAL | Status: DC

## 2016-09-03 NOTE — Discharge Summary (Signed)
Linda Nunez VHQ:[id number] DOB: 01/15/1996 DOA: 08/31/2016  PCP: Patient, No Pcp Per  Admit date: 08/31/2016  Discharge date: 09/03/2016  Admitted From: Home   Disposition:  Home   Recommendations for Outpatient Follow-up:   Follow up with PCP in 1-2 weeks  PCP Please obtain BMP/CBC, 2 view CXR in 1week,  (see Discharge instructions)   PCP Please follow up on the following pending results: Check CBC, BMP and a 2 view chest x-ray in 7-10 days   Home Health: None   Equipment/Devices: None Consultations: None Discharge Condition: Stable  CODE STATUS: Full  Diet Recommendation:  Heart Healthy    Chief Complaint  Patient presents with  . Nausea  . Emesis     Brief history of present illness from the day of admission and additional interim summary    Linda Nunez is a 21 y.o. woman with a history of chlamydia infection who presents to the ED for evaluation of nausea and vomiting, cough, chills, and sweats since Saturday.  She denies any known sick contacts or new exposures.  She chest soreness from coughing, was admitted for CAP.                                                                 Hospital Course    Sepsis secondary to CAP --Was placed on empiric Rocephin and azithromycin with excellent results, she is cough and shortness of breath free this morning, close to her baseline and wants to go home. Sepsis physiology has completely resolved and she appears nontoxic, so far all cultures have been negative. Will be given 4 more days of oral Levaquin and discharged home with PCP follow up. Request PCP to check CBC, BMP and a 2 view chest x-ray in 7-10 days.   Rales on exam 09/02/2016. Chest x-ray looks like interstitial pattern, this was excessive fluid administration induced, resolved after single dose  of Lasix, no rales no edema.  Nausea and vomiting with diarrhea though abdominal exam is benign Completely resolved with supportive care.  Sinus tachycardia Due to fevers resolved.  Hypokalemia. Replaced.    Discharge diagnosis     Principal Problem:   Sepsis (HCC) Active Problems:   CAP (community acquired pneumonia)   Nausea and vomiting   Sinus tachycardia   Fever    Discharge instructions    Discharge Instructions    Diet - low sodium heart healthy    Complete by:  As directed    Discharge instructions    Complete by:  As directed    Follow with Primary MD in 7 days   Get CBC, CMP, 2 view Chest X ray checked  by Primary MD or SNF MD in 5-7 days ( we routinely change or add medications that can affect your baseline labs and fluid  status, therefore we recommend that you get the mentioned basic workup next visit with your PCP, your PCP may decide not to get them or add new tests based on their clinical decision)  Activity: As tolerated with Full fall precautions use walker/cane & assistance as needed  Disposition Home    Diet:    Heart Healthy    For Heart failure patients - Check your Weight same time everyday, if you gain over 2 pounds, or you develop in leg swelling, experience more shortness of breath or chest pain, call your Primary MD immediately. Follow Cardiac Low Salt Diet and 1.5 lit/day fluid restriction.  On your next visit with your primary care physician please Get Medicines reviewed and adjusted.  Please request your Prim.MD to go over all Hospital Tests and Procedure/Radiological results at the follow up, please get all Hospital records sent to your Prim MD by signing hospital release before you go home.  If you experience worsening of your admission symptoms, develop shortness of breath, life threatening emergency, suicidal or homicidal thoughts you must seek medical attention immediately by calling 911 or calling your MD immediately  if symptoms  less severe.  You Must read complete instructions/literature along with all the possible adverse reactions/side effects for all the Medicines you take and that have been prescribed to you. Take any new Medicines after you have completely understood and accpet all the possible adverse reactions/side effects.   Do not drive, operate heavy machinery, perform activities at heights, swimming or participation in water activities or provide baby sitting services if your were admitted for syncope or siezures until you have seen by Primary MD or a Neurologist and advised to do so again.  Do not drive when taking Pain medications.    Do not take more than prescribed Pain, Sleep and Anxiety Medications  Special Instructions: If you have smoked or chewed Tobacco  in the last 2 yrs please stop smoking, stop any regular Alcohol  and or any Recreational drug use.  Wear Seat belts while driving.   Please note  You were cared for by a hospitalist during your hospital stay. If you have any questions about your discharge medications or the care you received while you were in the hospital after you are discharged, you can call the unit and asked to speak with the hospitalist on call if the hospitalist that took care of you is not available. Once you are discharged, your primary care physician will handle any further medical issues. Please note that NO REFILLS for any discharge medications will be authorized once you are discharged, as it is imperative that you return to your primary care physician (or establish a relationship with a primary care physician if you do not have one) for your aftercare needs so that they can reassess your need for medications and monitor your lab values.   Increase activity slowly    Complete by:  As directed       Discharge Medications   Allergies as of 09/03/2016      Reactions   Latex Itching, Rash      Medication List    STOP taking these medications     butalbital-acetaminophen-caffeine 50-325-40 MG tablet Commonly known as:  FIORICET, ESGIC     TAKE these medications   acetaminophen 500 MG tablet Commonly known as:  TYLENOL Take 500-1,000 mg by mouth every 6 (six) hours as needed for mild pain, fever or headache.   ibuprofen 200 MG tablet Commonly known as:  ADVIL,MOTRIN Take 200-400 mg by mouth every 6 (six) hours as needed for fever, headache or mild pain.   levofloxacin 750 MG tablet Commonly known as:  LEVAQUIN Take 1 tablet (750 mg total) by mouth daily.   potassium chloride 10 MEQ tablet Commonly known as:  K-DUR Take 2 tablets (20 mEq total) by mouth 2 (two) times daily.       Follow-up Information    [facility]. Schedule an appointment as soon as possible for a visit in 1 week(s).   Contact information: [address] [place] [address] [phone]          Major procedures and Radiology Reports - PLEASE review detailed and final reports thoroughly  -         Dg Chest 1 View  Result Date: 08/31/2016 CLINICAL DATA:  Nausea vomiting diarrhea fever and chills EXAM: CHEST 1 VIEW COMPARISON:  06/11/ 2018 FINDINGS: Streaky infiltrate overlying lingula and left base. No pleural effusion. Thoracic alignment within normal limits. IMPRESSION: Streaky infiltrates overlying the lingula and left lung base. No pleural effusion. Electronically Signed   By: Jasmine Pang M.D.   On: 08/31/2016 23:15   Dg Chest Port 1 View  Result Date: 09/02/2016 CLINICAL DATA:  Shortness of breath, community-acquired pneumonia, sepsis. EXAM: PORTABLE CHEST 1 VIEW COMPARISON:  Portable chest x-ray of August 31, 2016 FINDINGS: The lungs are well-expanded. The interstitial markings are increased bilaterally which is a marked change. The heart is top-normal in size. The pulmonary vascularity is not clearly engorged. The trachea is midline. There is no pleural effusion. The bony thorax is  unremarkable. IMPRESSION: Marked increase in the interstitial markings since the study of 2 days ago which may reflect progression of interstitial pneumonia or more likely interstitial edema. When the patient can tolerate the procedure, a PA and lateral chest x-ray would be useful. Electronically Signed   By: David  [place] M.D.   On: 09/02/2016 07:55   Dg Chest Port 1 View  Result Date: 08/31/2016 CLINICAL DATA:  Sepsis, pt brought in by EMS with n/v/d. Fever and chills. Tachy with EMS. Pt reports she hasn't eaten in days. Fever of 105.6 here. EXAM: PORTABLE CHEST 1 VIEW COMPARISON:  None. FINDINGS: Faint retrocardiac density on the left, uncertain significance. The lungs appear otherwise clear. Cardiac and mediastinal margins appear normal. The patient is rotated to the right on today's radiograph, reducing diagnostic sensitivity and specificity. IMPRESSION: 1. Faint left retrocardiac density could reflect atelectasis or pneumonia. If feasible, lateral projection may help in further characterization. Electronically Signed   By: Gaylyn Rong M.D.   On: 08/31/2016 20:57    Micro Results     Recent Results (from the past 240 hour(s))  Blood culture (routine x 2)     Status: None (Preliminary result)   Collection Time: 08/31/16  6:31 PM  Result Value Ref Range Status   Specimen Description BLOOD RIGHT ANTECUBITAL  Final   Special Requests   Final    BOTTLES DRAWN AEROBIC AND ANAEROBIC Blood Culture adequate volume   Culture NO GROWTH 2 DAYS  Final   Report Status PENDING  Incomplete  Blood culture (routine x 2)     Status: None (Preliminary result)   Collection Time: 08/31/16  6:43 PM  Result Value Ref Range Status   Specimen Description BLOOD LEFT HAND  Final   Special Requests IN PEDIATRIC BOTTLE Blood Culture adequate volume  Final   Culture NO GROWTH 2 DAYS  Final   Report Status PENDING  Incomplete  Urine Culture     Status: None   Collection Time: 08/31/16  9:10 PM  Result Value  Ref Range Status   Specimen Description URINE, CATHETERIZED  Final   Special Requests NONE  Final   Culture NO GROWTH  Final   Report Status 09/02/2016 FINAL  Final  Wet prep, genital     Status: Abnormal   Collection Time: 08/31/16  9:28 PM  Result Value Ref Range Status   Yeast Wet Prep HPF POC PRESENT (A) NONE SEEN Final   Trich, Wet Prep NONE SEEN NONE SEEN Final   Clue Cells Wet Prep HPF POC NONE SEEN NONE SEEN Final   WBC, Wet Prep HPF POC MODERATE (A) NONE SEEN Final   Sperm NONE SEEN  Final  MRSA PCR Screening     Status: None   Collection Time: 09/01/16  3:29 AM  Result Value Ref Range Status   MRSA by PCR NEGATIVE NEGATIVE Final    Comment:        The GeneXpert MRSA Assay (FDA approved for NASAL specimens only), is one component of a comprehensive MRSA colonization surveillance program. It is not intended to diagnose MRSA infection nor to guide or monitor treatment for MRSA infections.     Today   Subjective    Linda Nunez today has no headache,no chest abdominal pain,no new weakness tingling or numbness, feels much better wants to go home today.    Objective   Blood pressure 107/63, pulse 86, temperature 98.1 F (36.7 C), temperature source Oral, resp. rate 18, height 5\' 7"  (1.702 m), weight 80.7 kg (178 lb), last menstrual period 07/13/2016, SpO2 94 %.   Intake/Output Summary (Last 24 hours) at 09/03/16 0841 Last data filed at 09/03/16 0500  Gross per 24 hour  Intake              720 ml  Output              950 ml  Net             -230 ml    Exam Awake Alert, Oriented x 3, No new F.N deficits, Normal affect [place].AT,PERRAL Supple Neck,No JVD, No cervical lymphadenopathy appriciated.  Symmetrical Chest wall movement, Good air movement bilaterally, CTAB RRR,No Gallops,Rubs or new Murmurs, No Parasternal Heave +ve B.Sounds, Abd Soft, Non tender, No organomegaly appriciated, No rebound -guarding or rigidity. No Cyanosis, Clubbing or edema, No new Rash  or bruise   Data Review   CBC w Diff: Lab Results  Component Value Date   WBC 8.4 09/03/2016   HGB 9.7 (L) 09/03/2016   HCT 29.1 (L) 09/03/2016   PLT 313 09/03/2016    CMP: Lab Results  Component Value Date   NA 136 09/03/2016   K 3.2 (L) 09/03/2016   CL 100 (L) 09/03/2016   CO2 26 09/03/2016   BUN <5 (L) 09/03/2016   CREATININE 0.73 09/03/2016   PROT 8.3 (H) 08/31/2016   ALBUMIN 3.8 08/31/2016   BILITOT 1.1 08/31/2016   ALKPHOS 88 08/31/2016   AST 46 (H) 08/31/2016   ALT 32 08/31/2016  .   Total Time in preparing paper work, data evaluation and todays exam - 35 minutes  Susa Raring M.D on 09/03/2016 at 8:41 AM  [facility]   Office  [phone]

## 2016-09-03 NOTE — Progress Notes (Addendum)
     Linda Nunez was admitted to the Hospital on 08/31/2016 and Discharged  09/03/2016 and should be excused from work/school   for 4 days starting from date -  08/31/2016 , may return to work/school without any restrictions.  Call Susa RaringPrashant Kanye Depree MD, [facility]  [phone] with questions.  Susa RaringPrashant Janne Faulk M.D on 09/03/2016,at 9:42 AM  [facility]   Office  [phone]

## 2016-09-03 NOTE — Discharge Instructions (Signed)
Follow with Primary MD  in 7 days  ° °Get CBC, CMP, 2 view Chest X ray checked  by Primary MD or SNF MD in 5-7 days ( we routinely change or add medications that can affect your baseline labs and fluid status, therefore we recommend that you get the mentioned basic workup next visit with your PCP, your PCP may decide not to get them or add new tests based on their clinical decision) ° °Activity: As tolerated with Full fall precautions use walker/cane & assistance as needed ° °Disposition Home   ° °Diet:   Heart Healthy   ° °For Heart failure patients - Check your Weight same time everyday, if you gain over 2 pounds, or you develop in leg swelling, experience more shortness of breath or chest pain, call your Primary MD immediately. Follow Cardiac Low Salt Diet and 1.5 lit/day fluid restriction. ° °On your next visit with your primary care physician please Get Medicines reviewed and adjusted. ° °Please request your Prim.MD to go over all Hospital Tests and Procedure/Radiological results at the follow up, please get all Hospital records sent to your Prim MD by signing hospital release before you go home. ° °If you experience worsening of your admission symptoms, develop shortness of breath, life threatening emergency, suicidal or homicidal thoughts you must seek medical attention immediately by calling 911 or calling your MD immediately  if symptoms less severe. ° °You Must read complete instructions/literature along with all the possible adverse reactions/side effects for all the Medicines you take and that have been prescribed to you. Take any new Medicines after you have completely understood and accpet all the possible adverse reactions/side effects.  ° °Do not drive, operate heavy machinery, perform activities at heights, swimming or participation in water activities or provide baby sitting services if your were admitted for syncope or siezures until you have seen by Primary MD or a Neurologist and advised to do  so again. ° °Do not drive when taking Pain medications.  ° ° °Do not take more than prescribed Pain, Sleep and Anxiety Medications ° °Special Instructions: If you have smoked or chewed Tobacco  in the last 2 yrs please stop smoking, stop any regular Alcohol  and or any Recreational drug use. ° °Wear Seat belts while driving. ° ° °Please note ° °You were cared for by a hospitalist during your hospital stay. If you have any questions about your discharge medications or the care you received while you were in the hospital after you are discharged, you can call the unit and asked to speak with the hospitalist on call if the hospitalist that took care of you is not available. Once you are discharged, your primary care physician will handle any further medical issues. Please note that NO REFILLS for any discharge medications will be authorized once you are discharged, as it is imperative that you return to your primary care physician (or establish a relationship with a primary care physician if you do not have one) for your aftercare needs so that they can reassess your need for medications and monitor your lab values. ° °

## 2016-09-03 NOTE — Plan of Care (Signed)
Problem: Education: Goal: Knowledge of [employer] General Education information/materials will improve Outcome: Progressing POC reviewed with pt.  Problem: Pain Managment: Goal: General experience of comfort will improve Outcome: Progressing Resting well; no c/o's pain- will notify nurse if needed.

## 2016-09-05 LAB — CULTURE, BLOOD (ROUTINE X 2)
Culture: NO GROWTH
Culture: NO GROWTH
Special Requests: ADEQUATE
Special Requests: ADEQUATE

## 2016-10-03 ENCOUNTER — Encounter (HOSPITAL_COMMUNITY): Payer: Self-pay

## 2016-10-03 ENCOUNTER — Emergency Department (HOSPITAL_COMMUNITY)
Admission: EM | Admit: 2016-10-03 | Discharge: 2016-10-03 | Discharge status: Against Medical Advice | Payer: Medicaid Other | Attending: Emergency Medicine | Admitting: Emergency Medicine

## 2016-10-03 DIAGNOSIS — Z5321 Procedure and treatment not carried out due to patient leaving prior to being seen by health care provider: Secondary | ICD-10-CM | POA: Insufficient documentation

## 2016-10-03 DIAGNOSIS — R102 Pelvic and perineal pain: Secondary | ICD-10-CM | POA: Insufficient documentation

## 2016-10-03 NOTE — ED Triage Notes (Signed)
Pt presents with c/o vaginal pain. Pt reports that she started her period on 7/12 and has been passing clots since then. Pt reports that today she started to have vaginal pain and vomited multiple times.

## 2016-10-03 NOTE — ED Notes (Signed)
Called for last time No response from lobby 

## 2016-10-03 NOTE — ED Notes (Signed)
Pt called again. No response 

## 2016-10-03 NOTE — ED Notes (Signed)
Called to take to room  No response from lobby  

## 2017-12-26 ENCOUNTER — Encounter (HOSPITAL_COMMUNITY): Payer: Self-pay | Admitting: Emergency Medicine

## 2017-12-26 ENCOUNTER — Emergency Department (HOSPITAL_COMMUNITY)
Admission: EM | Admit: 2017-12-26 | Discharge: 2017-12-26 | Disposition: A | Discharge status: Home / Self Care | Payer: Self-pay | Attending: Emergency Medicine | Admitting: Emergency Medicine

## 2017-12-26 ENCOUNTER — Other Ambulatory Visit: Payer: Self-pay

## 2017-12-26 DIAGNOSIS — Z711 Person with feared health complaint in whom no diagnosis is made: Secondary | ICD-10-CM | POA: Insufficient documentation

## 2017-12-26 DIAGNOSIS — Z91040 Latex allergy status: Secondary | ICD-10-CM | POA: Insufficient documentation

## 2017-12-26 DIAGNOSIS — Z7722 Contact with and (suspected) exposure to environmental tobacco smoke (acute) (chronic): Secondary | ICD-10-CM | POA: Insufficient documentation

## 2017-12-26 DIAGNOSIS — R102 Pelvic and perineal pain: Secondary | ICD-10-CM | POA: Insufficient documentation

## 2017-12-26 LAB — COMPREHENSIVE METABOLIC PANEL
ALBUMIN: 3.9 g/dL (ref 3.5–5.0)
ALT: 15 U/L (ref 0–44)
AST: 22 U/L (ref 15–41)
Alkaline Phosphatase: 58 U/L (ref 38–126)
Anion gap: 8 (ref 5–15)
BUN: 6 mg/dL (ref 6–20)
CHLORIDE: 107 mmol/L (ref 98–111)
CO2: 24 mmol/L (ref 22–32)
Calcium: 9.1 mg/dL (ref 8.9–10.3)
Creatinine, Ser: 0.98 mg/dL (ref 0.44–1.00)
GFR calc Af Amer: >60 mL/min (ref >60)
GFR calc non Af Amer: >60 mL/min (ref >60)
GLUCOSE: 101 mg/dL — AB (ref 70–99)
POTASSIUM: 3.1 mmol/L — AB (ref 3.5–5.1)
SODIUM: 139 mmol/L (ref 135–145)
Total Bilirubin: 0.6 mg/dL (ref 0.3–1.2)
Total Protein: 6.7 g/dL (ref 6.5–8.1)

## 2017-12-26 LAB — URINALYSIS, ROUTINE W REFLEX MICROSCOPIC
BACTERIA UA: NONE SEEN
BILIRUBIN URINE: NEGATIVE
Glucose, UA: NEGATIVE mg/dL
Ketones, ur: NEGATIVE mg/dL
LEUKOCYTES UA: NEGATIVE
Nitrite: NEGATIVE
Protein, ur: NEGATIVE mg/dL
SPECIFIC GRAVITY, URINE: 1.021 (ref 1.005–1.030)
pH: 5 (ref 5.0–8.0)

## 2017-12-26 LAB — CBC
HEMATOCRIT: 36.8 % (ref 36.0–46.0)
Hemoglobin: 12.2 g/dL (ref 12.0–15.0)
MCH: 31.5 pg (ref 26.0–34.0)
MCHC: 33.2 g/dL (ref 30.0–36.0)
MCV: 95.1 fL (ref 78.0–100.0)
Platelets: 326 10*3/uL (ref 150–400)
RBC: 3.87 MIL/uL (ref 3.87–5.11)
RDW: 11.9 % (ref 11.5–15.5)
WBC: 7.7 10*3/uL (ref 4.0–10.5)

## 2017-12-26 LAB — I-STAT BETA HCG BLOOD, ED (MC, WL, AP ONLY)

## 2017-12-26 LAB — WET PREP, GENITAL
Sperm: NONE SEEN
Trich, Wet Prep: NONE SEEN
Yeast Wet Prep HPF POC: NONE SEEN

## 2017-12-26 LAB — LIPASE, BLOOD: LIPASE: 19 U/L (ref 11–51)

## 2017-12-26 MED ORDER — POTASSIUM CHLORIDE CRYS ER 20 MEQ PO TBCR
40.0000 meq | EXTENDED_RELEASE_TABLET | Freq: Once | ORAL | Status: AC
  Administered 2017-12-26: 40 meq via ORAL
  Filled 2017-12-26: qty 2

## 2017-12-26 MED ORDER — CEFTRIAXONE SODIUM 250 MG IJ SOLR
250.0000 mg | Freq: Once | INTRAMUSCULAR | Status: AC
  Administered 2017-12-26: 250 mg via INTRAMUSCULAR
  Filled 2017-12-26: qty 250

## 2017-12-26 MED ORDER — LIDOCAINE HCL (PF) 1 % IJ SOLN
2.0000 mL | Freq: Once | INTRAMUSCULAR | Status: AC
  Administered 2017-12-26: 07:00:00
  Filled 2017-12-26: qty 5

## 2017-12-26 MED ORDER — AZITHROMYCIN 250 MG PO TABS
1000.0000 mg | ORAL_TABLET | Freq: Once | ORAL | Status: AC
  Administered 2017-12-26: 1000 mg via ORAL
  Filled 2017-12-26: qty 4

## 2017-12-26 NOTE — ED Triage Notes (Addendum)
Patient reports she started her period on 10/2, reports tonight she woke up with some lower abdominal cramping and had one episode of vomiting. Reports pain in her vagina and painful urination

## 2017-12-26 NOTE — ED Provider Notes (Signed)
[facility] EMERGENCY DEPARTMENT Provider Note   CSN: [id number] Arrival date & time: 12/26/17  0319     History   Chief Complaint Chief Complaint  Patient presents with  . Abdominal Pain    HPI Linda Nunez is a 22 y.o. female with a hx of chlamydia presents to the Emergency Department complaining of gradual, persistent, progressively worsening lower abd pain onset 2am. Associated symptoms include vaginal pain. Pt reports LMP was 12/22/17 and she believes it ended last night.  Pt reports the pain was intermittent, sharp and throbbing.  No pain at this time.  Pt with 1 episode of emesis here in the department.  Emesis was NBNB.  No aggravating or alleviating symptoms.  No treatments PTA.  Denies fever, chills, headache, neck pain, chest pain, SOB, dysuria, vaginal discharge.  Pt reports she is sexually active with 1 female partner and no contraception.  Pt with hx of chlamydia and syphilis.    The history is provided by the patient and medical records. No language interpreter was used.    Past Medical History:  Diagnosis Date  . Chlamydia     Patient Active Problem List   Diagnosis Date Noted  . Sepsis (HCC) 09/01/2016  . CAP (community acquired pneumonia) 09/01/2016  . Nausea and vomiting 09/01/2016  . Sinus tachycardia 09/01/2016  . Fever 09/01/2016    History reviewed. No pertinent surgical history.   OB History    Gravida  1   Para      Term      Preterm      AB      Living        SAB      TAB      Ectopic      Multiple      Live Births               Home Medications    Prior to Admission medications   Medication Sig Start Date End Date Taking? Authorizing Provider  potassium chloride (K-DUR) 10 MEQ tablet Take 2 tablets (20 mEq total) by mouth 2 (two) times daily. Patient not taking: Reported on 12/26/2017 09/03/16   Leroy Sea, MD    Family History Family History  Problem Relation Age of Onset  . Asthma Mother    . Diabetes Father     Social History Social History   Tobacco Use  . Smoking status: Passive Smoke Exposure - Never Smoker  . Smokeless tobacco: Never Used  Substance Use Topics  . Alcohol use: Yes  . Drug use: Yes    Types: Marijuana     Allergies   Latex   Review of Systems Review of Systems  Constitutional: Negative for appetite change, diaphoresis, fatigue, fever and unexpected weight change.  HENT: Negative for mouth sores.   Eyes: Negative for visual disturbance.  Respiratory: Negative for cough, chest tightness, shortness of breath and wheezing.   Cardiovascular: Negative for chest pain.  Gastrointestinal: Positive for abdominal pain ( resolved) and vomiting (X1). Negative for constipation, diarrhea and nausea.  Endocrine: Negative for polydipsia, polyphagia and polyuria.  Genitourinary: Negative for dysuria, frequency, hematuria and urgency.  Musculoskeletal: Negative for back pain and neck stiffness.  Skin: Negative for rash.  Allergic/Immunologic: Negative for immunocompromised state.  Neurological: Negative for syncope, light-headedness and headaches.  Hematological: Does not bruise/bleed easily.  Psychiatric/Behavioral: Negative for sleep disturbance. The patient is not nervous/anxious.      Physical Exam Updated Vital Signs BP  127/77   Pulse (!) 103   Resp 15   LMP 12/22/2017 (Exact Date)   SpO2 99%   Breastfeeding? Unknown   Physical Exam  Constitutional: She appears well-developed and well-nourished. No distress.  Awake, alert, nontoxic appearance  HENT:  Head: Normocephalic and atraumatic.  Mouth/Throat: Oropharynx is clear and moist. No oropharyngeal exudate.  Eyes: Conjunctivae are normal. No scleral icterus.  Neck: Normal range of motion. Neck supple.  Cardiovascular: Normal rate, regular rhythm, normal heart sounds and intact distal pulses.  No murmur heard. Pulmonary/Chest: Effort normal and breath sounds normal. No respiratory  distress. She has no wheezes.  Equal chest expansion  Abdominal: Soft. Bowel sounds are normal. She exhibits no mass. There is no tenderness. There is no rebound and no guarding. Hernia confirmed negative in the right inguinal area and confirmed negative in the left inguinal area.  Genitourinary: Uterus normal. No labial fusion. There is no rash, tenderness or lesion on the right labia. There is no rash, tenderness or lesion on the left labia. Uterus is not deviated, not enlarged, not fixed and not tender. Cervix exhibits no motion tenderness, no discharge and no friability. Right adnexum displays no mass, no tenderness and no fullness. Left adnexum displays no mass, no tenderness and no fullness. No erythema, tenderness or bleeding in the vagina. No foreign body in the vagina. No signs of injury around the vagina. Vaginal discharge (moderate, brown, malodorous) found.  Musculoskeletal: Normal range of motion. She exhibits no edema.  Lymphadenopathy:       Right: No inguinal adenopathy present.       Left: No inguinal adenopathy present.  Neurological: She is alert.  Speech is clear and goal oriented Moves extremities without ataxia  Skin: Skin is warm and dry. She is not diaphoretic. No erythema.  Psychiatric: She has a normal mood and affect.  Nursing note and vitals reviewed.    ED Treatments / Results  Labs (all labs ordered are listed, but only abnormal results are displayed) Labs Reviewed  WET PREP, GENITAL - Abnormal; Notable for the following components:      Result Value   Clue Cells Wet Prep HPF POC PRESENT (*)    WBC, Wet Prep HPF POC MANY (*)    All other components within normal limits  COMPREHENSIVE METABOLIC PANEL - Abnormal; Notable for the following components:   Potassium 3.1 (*)    Glucose, Bld 101 (*)    All other components within normal limits  URINALYSIS, ROUTINE W REFLEX MICROSCOPIC - Abnormal; Notable for the following components:   APPearance HAZY (*)    Hgb  urine dipstick MODERATE (*)    All other components within normal limits  LIPASE, BLOOD  CBC  I-STAT BETA HCG BLOOD, ED ([facility], [facility], [facility] ONLY)  GC/CHLAMYDIA PROBE AMP ([place]) NOT AT [facility]     Procedures Procedures (including critical care time)  Medications Ordered in ED Medications  cefTRIAXone (ROCEPHIN) injection 250 mg (has no administration in time range)  azithromycin (ZITHROMAX) tablet 1,000 mg (has no administration in time range)  potassium chloride SA (K-DUR,KLOR-CON) CR tablet 40 mEq (40 mEq Oral Given 12/26/17 0521)     Initial Impression / Assessment and Plan / ED Course  I have reviewed the triage vital signs and the nursing notes.  Pertinent labs & imaging results that were available during my care of the patient were reviewed by me and considered in my medical decision making (see chart for details).     Pt  presents with concerns for possible STD.  Pt understands that they have GC/Chlamydia cultures pending and that they will need to inform all sexual partners if results return positive. Pt has been treated prophylactically with azithromycin and Rocephin due to pts history, pelvic exam, and wet prep with increased WBCs.  Pt not concerning for PID because hemodynamically stable and no cervical motion tenderness on pelvic exam.  Patient to be discharged with instructions to follow up with OBGYN/PCP. Discussed importance of using protection when sexually active.    Final Clinical Impressions(s) / ED Diagnoses   Final diagnoses:  Vaginal pain  Concern about STD in female without diagnosis    ED Discharge Orders    None       Genie Wenke, Boyd Kerbs 12/26/17 1610    Geoffery Lyons, MD 12/26/17 [phone]

## 2017-12-26 NOTE — Discharge Instructions (Addendum)
1. Medications: usual home medications 2. Treatment: rest, drink plenty of fluids, use a condom with every sexual encounter 3. Follow Up: Please followup with your OB/GYN or primary doctor in 3 days for discussion of your diagnoses and further evaluation after today's visit; if you do not have a primary care doctor use the resource guide provided to find one; Please return to the ER for worsening symptoms, high fevers or persistent vomiting.  You have been tested for chlamydia and gonorrhea.  These results will be available in approximately 3 days.  Please inform all sexual partners if you test positive for any of these diseases.

## 2017-12-26 NOTE — ED Notes (Signed)
No s/s of reaction noted to IM Rocephin.

## 2017-12-27 LAB — GC/CHLAMYDIA PROBE AMP (~~LOC~~) NOT AT ARMC
Chlamydia: NEGATIVE
NEISSERIA GONORRHEA: NEGATIVE

## 2018-11-29 ENCOUNTER — Emergency Department (HOSPITAL_COMMUNITY)
Admission: EM | Admit: 2018-11-29 | Discharge: 2018-11-29 | Discharge status: Against Medical Advice | Payer: Self-pay | Attending: Emergency Medicine | Admitting: Emergency Medicine

## 2018-11-29 DIAGNOSIS — R112 Nausea with vomiting, unspecified: Secondary | ICD-10-CM | POA: Insufficient documentation

## 2018-11-29 DIAGNOSIS — Z7722 Contact with and (suspected) exposure to environmental tobacco smoke (acute) (chronic): Secondary | ICD-10-CM | POA: Insufficient documentation

## 2018-11-29 DIAGNOSIS — Z91040 Latex allergy status: Secondary | ICD-10-CM | POA: Insufficient documentation

## 2018-11-29 LAB — POC URINE PREG, ED: Preg Test, Ur: NEGATIVE

## 2018-11-29 NOTE — ED Notes (Signed)
Pt walked out AMA. Did not alert anyone to her leaving. Was not tested for covid or discharged by provider.

## 2018-11-29 NOTE — ED Provider Notes (Signed)
[place] EMERGENCY DEPARTMENT Provider Note   CSN: [id number] Arrival date & time: 11/29/18  1709     History   Chief Complaint Chief Complaint  Patient presents with  . Emesis    HPI Linda Nunez is a 23 y.o. female.     HPI    24 year old female presents today with complaints of nausea and vomiting.  Patient notes morning sickness over the last couple mornings, she had an episode of vomiting this morning while at work.  She notes this is likely secondary to being dehydrated.  She denies any fever abdominal pain cough or shortness of breath.  She was told to be evaluated in the emergency room by her workplace.    Past Medical History:  Diagnosis Date  . Chlamydia     Patient Active Problem List   Diagnosis Date Noted  . Sepsis ([place]) 09/01/2016  . CAP (community acquired pneumonia) 09/01/2016  . Nausea and vomiting 09/01/2016  . Sinus tachycardia 09/01/2016  . Fever 09/01/2016    No past surgical history on file.   OB History    Gravida  1   Para      Term      Preterm      AB      Living        SAB      TAB      Ectopic      Multiple      Live Births               Home Medications    Prior to Admission medications   Medication Sig Start Date End Date Taking? Authorizing Provider  potassium chloride (K-DUR) 10 MEQ tablet Take 2 tablets (20 mEq total) by mouth 2 (two) times daily. Patient not taking: Reported on 12/26/2017 09/03/16   Thurnell Lose, MD    Family History Family History  Problem Relation Age of Onset  . Asthma Mother   . Diabetes Father     Social History Social History   Tobacco Use  . Smoking status: Passive Smoke Exposure - Never Smoker  . Smokeless tobacco: Never Used  Substance Use Topics  . Alcohol use: Yes  . Drug use: Yes    Types: Marijuana     Allergies   Latex   Review of Systems Review of Systems  All other systems reviewed and are negative.   Physical Exam  Updated Vital Signs BP 113/74 (BP Location: Right Arm)   Pulse 68   Temp 98.4 F (36.9 C) (Oral)   Resp 16   LMP 10/26/2018 (Exact Date)   SpO2 100%   Physical Exam Vitals signs and nursing note reviewed.  Constitutional:      Appearance: She is well-developed.  HENT:     Head: Normocephalic and atraumatic.  Eyes:     General: No scleral icterus.       Right eye: No discharge.        Left eye: No discharge.     Conjunctiva/sclera: Conjunctivae normal.     Pupils: Pupils are equal, round, and reactive to light.  Neck:     Musculoskeletal: Normal range of motion.     Vascular: No JVD.     Trachea: No tracheal deviation.  Pulmonary:     Effort: Pulmonary effort is normal.     Breath sounds: No stridor.  Abdominal:     General: There is no distension.     Palpations: Abdomen is soft.  Tenderness: There is no abdominal tenderness. There is no guarding.  Neurological:     Mental Status: She is alert and oriented to person, place, and time.     Coordination: Coordination normal.  Psychiatric:        Behavior: Behavior normal.        Thought Content: Thought content normal.        Judgment: Judgment normal.      ED Treatments / Results  Labs (all labs ordered are listed, but only abnormal results are displayed) Labs Reviewed  NOVEL CORONAVIRUS, NAA (HOSP ORDER, SEND-OUT TO REF LAB; TAT 18-24 HRS)  POC URINE PREG, ED    EKG None  Radiology No results found.  Procedures Procedures (including critical care time)  Medications Ordered in ED Medications - No data to display   Initial Impression / Assessment and Plan / ED Course  I have reviewed the triage vital signs and the nursing notes.  Pertinent labs & imaging results that were available during my care of the patient were reviewed by me and considered in my medical decision making (see chart for details).         Assessment/Plan: [age] YOF presents today with nausea and vomiting.  She has no abdominal  pain no fever, no other signs of infectious etiology.  I will test her for COVID given the GI symptoms.    Patient eloped prior to my reassessment or any disposition   Final Clinical Impressions(s) / ED Diagnoses   Final diagnoses:  Non-intractable vomiting with nausea, unspecified vomiting type    ED Discharge Orders    None       Eyvonne MechanicHedges, Chaim Gatley, PA-C 12/01/18 1411    Melene PlanFloyd, Dan, DO 12/03/18 [phone]

## 2018-11-29 NOTE — ED Triage Notes (Signed)
Patient reports morning sickness the last few mornings and states she vomited once this morning while at work. Denies nausea at this time. LMP 8/5. No other symptoms.

## 2018-11-29 NOTE — ED Notes (Signed)
Pt was asking when she was going to leave, was told that she was just waiting for discharge papers.

## 2019-06-20 ENCOUNTER — Encounter: Payer: Self-pay | Admitting: Family Medicine

## 2019-06-20 ENCOUNTER — Ambulatory Visit: Payer: Medicaid Other | Admitting: Family Medicine

## 2019-06-20 ENCOUNTER — Other Ambulatory Visit: Payer: Self-pay

## 2019-06-20 ENCOUNTER — Other Ambulatory Visit (HOSPITAL_COMMUNITY)
Admission: RE | Admit: 2019-06-20 | Discharge: 2019-06-20 | Disposition: A | Discharge status: Home / Self Care | Payer: Medicaid Other | Source: Ambulatory Visit | Attending: Family Medicine | Admitting: Family Medicine

## 2019-06-20 VITALS — BP 120/61 | HR 80 | Temp 98.7°F | Ht 64.0 in | Wt 201.0 lb

## 2019-06-20 DIAGNOSIS — N760 Acute vaginitis: Secondary | ICD-10-CM | POA: Insufficient documentation

## 2019-06-20 DIAGNOSIS — Z7251 High risk heterosexual behavior: Secondary | ICD-10-CM

## 2019-06-20 DIAGNOSIS — Z0000 Encounter for general adult medical examination without abnormal findings: Secondary | ICD-10-CM

## 2019-06-20 DIAGNOSIS — N926 Irregular menstruation, unspecified: Secondary | ICD-10-CM

## 2019-06-20 DIAGNOSIS — B9689 Other specified bacterial agents as the cause of diseases classified elsewhere: Secondary | ICD-10-CM

## 2019-06-20 DIAGNOSIS — L309 Dermatitis, unspecified: Secondary | ICD-10-CM | POA: Insufficient documentation

## 2019-06-20 DIAGNOSIS — E669 Obesity, unspecified: Secondary | ICD-10-CM

## 2019-06-20 DIAGNOSIS — R21 Rash and other nonspecific skin eruption: Secondary | ICD-10-CM

## 2019-06-20 DIAGNOSIS — Z124 Encounter for screening for malignant neoplasm of cervix: Secondary | ICD-10-CM

## 2019-06-20 DIAGNOSIS — Z833 Family history of diabetes mellitus: Secondary | ICD-10-CM

## 2019-06-20 LAB — POCT WET PREP (WET MOUNT)
Clue Cells Wet Prep Whiff POC: POSITIVE
Trichomonas Wet Prep HPF POC: ABSENT

## 2019-06-20 LAB — POCT URINE PREGNANCY: Preg Test, Ur: NEGATIVE

## 2019-06-20 MED ORDER — TRIAMCINOLONE ACETONIDE 0.025 % EX OINT: 1.0000 "application " | TOPICAL_OINTMENT | Freq: Two times a day (BID) | CUTANEOUS | 0 refills | Status: DC

## 2019-06-20 MED ORDER — METRONIDAZOLE 500 MG PO TABS: 500.0000 mg | ORAL_TABLET | Freq: Three times a day (TID) | ORAL | 0 refills | Status: DC

## 2019-06-20 NOTE — Progress Notes (Signed)
New Patient Office Visit  Subjective:  Patient ID: [place], female    DOB: 1995-07-26  Age: 24 y.o. MRN: [id number]  CC:  Chief Complaint  Patient presents with  . New Patient (Initial Visit)    HPI Linda Nunez presents to establish care.  Has concerns for hand rash and missed period today.      New Patient : PMHx: Eczema    Meds: None    ALL: Latex (rash, itching)    SurgHx: None    GYNHx: G0P0. Patient's last menstrual period was 05/09/2019. Reports history of irregular periods in her past.  Sexually active with men however does not use condoms. History of chlamydia.     FMHx: Father DM, Mother- Asthma.  Multiple family members with HTN.    Social Hx:  Smokes THC and drinks alcohol. Denies current cigarette use. 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      Hand Rash  Weeping occasional drainage rash that started at the begning of this month.  Benadryl didn't help.  Stated she got her hair done and believes she may have had an reaction to the hair gel that was used. Scratched her head and gel got on her fingers. Reports similar rash on her forehead that resolved. Has hx of eczema.       ROS Review of Systems  Constitutional: Negative for fever.  HENT: Negative for congestion and sore throat.   Eyes: Negative for itching and visual disturbance.  Respiratory: Negative for cough and shortness of breath.   Cardiovascular: Negative for chest pain and palpitations.  Gastrointestinal: Negative for abdominal pain, blood in stool, constipation, diarrhea, nausea and vomiting.  Genitourinary: Negative for dysuria.  Musculoskeletal: Negative for back pain and neck pain.  Skin: Positive for rash and wound.  Neurological: Negative for headaches.  Psychiatric/Behavioral: Negative for self-injury (hx of but non recently ), sleep disturbance and suicidal ideas.    Objective:   Today's Vitals: BP 120/61   Pulse 80   Temp 98.7 F  (37.1 C) (Oral)   Ht 5\' 4"  (1.626 m)   Wt 201 lb (91.2 kg)   LMP 06/06/2019   SpO2 98%   BMI 34.50 kg/m   Physical Exam Constitutional:      General: She is not in acute distress.    Appearance: Normal appearance.  HENT:     Head: Normocephalic and atraumatic.     Right Ear: There is impacted cerumen.     Left Ear: Tympanic membrane normal.     Nose: Nose normal.     Mouth/Throat:     Mouth: Mucous membranes are moist.     Pharynx: Oropharynx is clear. No oropharyngeal exudate or posterior oropharyngeal erythema.  Eyes:     General: No scleral icterus.    Extraocular Movements: Extraocular movements intact.     Conjunctiva/sclera: Conjunctivae normal.     Pupils: Pupils are equal, round, and reactive to light.  Cardiovascular:     Rate and Rhythm: Normal rate and regular rhythm.     Pulses: Normal pulses.     Heart sounds: Normal heart sounds. No murmur.  Pulmonary:     Effort: Pulmonary effort is normal. No respiratory distress.     Breath sounds: Normal breath sounds.  Abdominal:     General: Bowel sounds are normal.     Palpations: Abdomen is soft. There is no mass.     Tenderness: There is no abdominal tenderness. There  is no right CVA tenderness, left CVA tenderness or guarding.  Genitourinary:    General: Normal vulva.     Vagina: Vaginal discharge present.     Comments: Thin white vaginal discharge present in vaginal canal and cervical os, no lesions. No palpable masses on bimanual exam  Musculoskeletal:        General: No tenderness. Normal range of motion.     Cervical back: Normal range of motion and neck supple.     Right lower leg: No edema.     Left lower leg: No edema.  Skin:    General: Skin is warm and dry.     Capillary Refill: Capillary refill takes less than 2 seconds.     Comments: Bilateral 1-3rd fingers with maculopapular rash, erythema, some edema present, no discharge   Neurological:     Mental Status: She is alert and oriented to person,  place, and time.     Comments: CN 2-12 grossly intact   Psychiatric:        Mood and Affect: Mood normal.        Behavior: Behavior normal.        Thought Content: Thought content normal.        Judgment: Judgment normal.     Assessment & Plan:   Problem List Items Addressed This Visit      Musculoskeletal and Integument   Eczema   Relevant Medications   triamcinolone (KENALOG) 0.025 % ointment   Rash of both hands    Etiology uncertain. DDX allergic/irritant dermatitis, eczema, cellulitis, fungal infection.  Will treat with Kenalog ointment patient to follow up if not improving or if symptoms worsen.            Genitourinary   Bacterial vaginosis    Wet prep positive for BV.  Will treat with 500 mg TID course of Flagyl.  Advised patient not to drink EtOH while taking this medication.        Relevant Medications   metroNIDAZOLE (FLAGYL) 500 MG tablet     Other   High risk heterosexual behavior    Patient does not use barrier protection/condoms.  Counseled patient on the need to use condoms during intercourse. Has remote hx of chlamydia.  - Follow up HIV and RPR      Relevant Orders   RPR   HIV antibody (with reflex)   POCT Wet Prep [facility]) (Completed)   Obesity (BMI 30.0-34.9)    Follow up lipid panel and CMP.  A1c obtained and was WNL.        Relevant Orders   Basic Metabolic Panel   Lipid Panel    Other Visit Diagnoses    Missed period    -  Primary   Relevant Orders   POCT Urine pregnancy (Completed)   Family history of diabetes mellitus (DM)       Relevant Orders   Basic Metabolic Panel   HgB A1c   Healthcare maintenance       Relevant Orders   Basic Metabolic Panel   Screening for cervical cancer       Relevant Orders   Cytology - PAP([place]) (Completed)      Outpatient Encounter Medications as of 06/20/2019  Medication Sig  . metroNIDAZOLE (FLAGYL) 500 MG tablet Take 1 tablet (500 mg total) by mouth 3 (three) times daily.  Marland Kitchen  triamcinolone (KENALOG) 0.025 % ointment Apply 1 application topically 2 (two) times daily.  . [DISCONTINUED] potassium chloride (K-DUR) 10 MEQ tablet  Take 2 tablets (20 mEq total) by mouth 2 (two) times daily. (Patient not taking: Reported on 12/26/2017)   No facility-administered encounter medications on file as of 06/20/2019.    Follow-up: Return in about 1 year (around 06/19/2020), or if symptoms worsen or fail to improve.   Katha Cabal, DO

## 2019-06-20 NOTE — Patient Instructions (Addendum)
It was great seeing you today!   I'd like to see you back in 1 year but if you need to be seen earlier than that for any new issues we're happy to fit you in, just give [place] a call!  - Stop by your pharmacy to pick up your antibiotics.  DO NOT drink alcohol while taking this medication  - Do not douche and avoid semen inside your vagina.    If you have questions or concerns please do not hesitate to call at [phone].  Dr. K[facility]     Bacterial Vaginosis  Bacterial vaginosis is an infection of the vagina. It happens when too many normal germs (healthy bacteria) grow in the vagina. This infection puts you at risk for infections from sex (STIs). Treating this infection can lower your risk for some STIs. You should also treat this if you are pregnant. It can cause your baby to be born early. Follow these instructions at home: Medicines  Take over-the-counter and prescription medicines only as told by your doctor.  Take or use your antibiotic medicine as told by your doctor. Do not stop taking or using it even if you start to feel better. General instructions  If you your sexual partner is a woman, tell her that you have this infection. She needs to get treatment if she has symptoms. If you have a female partner, he does not need to be treated.  During treatment: ? Avoid sex. ? Do not douche. ? Avoid alcohol as told. ? Avoid breastfeeding as told.  Drink enough fluid to keep your pee (urine) clear or pale yellow.  Keep your vagina and butt (rectum) clean. ? Wash the area with warm water every day. ? Wipe from front to back after you use the toilet.  Keep all follow-up visits as told by your doctor. This is important. Preventing this condition  Do not douche.  Use only warm water to wash around your vagina.  Use protection when you have sex. This includes: ? Latex condoms. ? Dental dams.  Limit how many people you have sex with. It is  best to only have sex with the same person (be monogamous).  Get tested for STIs. Have your partner get tested.  Wear underwear that is cotton or lined with cotton.  Avoid tight pants and pantyhose. This is most important in summer.  Do not use any products that have nicotine or tobacco in them. These include cigarettes and e-cigarettes. If you need help quitting, ask your doctor.  Do not use illegal drugs.  Limit how much alcohol you drink. Contact a doctor if:  Your symptoms do not get better, even after you are treated.  You have more discharge or pain when you pee (urinate).  You have a fever.  You have pain in your belly (abdomen).  You have pain with sex.  Your bleed from your vagina between periods. Summary  This infection happens when too many germs (bacteria) grow in the vagina.  Treating this condition can lower your risk for some infections from sex (STIs).  You should also treat this if you are pregnant. It can cause early (premature) birth.  Do not stop taking or using your antibiotic medicine even if you start to feel better. This information is not intended to replace advice given to you by your health care provider. Make sure you discuss any questions you have with your health care provider. Document Revised: 02/19/2017 Document Reviewed: 11/23/2015 Elsevier  Patient Education  [employer].

## 2019-06-22 ENCOUNTER — Encounter: Payer: Self-pay | Admitting: Family Medicine

## 2019-06-22 DIAGNOSIS — E669 Obesity, unspecified: Secondary | ICD-10-CM | POA: Insufficient documentation

## 2019-06-22 DIAGNOSIS — Z7251 High risk heterosexual behavior: Secondary | ICD-10-CM | POA: Insufficient documentation

## 2019-06-22 DIAGNOSIS — B9689 Other specified bacterial agents as the cause of diseases classified elsewhere: Secondary | ICD-10-CM | POA: Insufficient documentation

## 2019-06-22 DIAGNOSIS — R21 Rash and other nonspecific skin eruption: Secondary | ICD-10-CM | POA: Insufficient documentation

## 2019-06-22 LAB — CYTOLOGY - PAP
Chlamydia: NEGATIVE
Comment: NEGATIVE
Comment: NEGATIVE
Comment: NORMAL
Diagnosis: NEGATIVE
High risk HPV: NEGATIVE
Neisseria Gonorrhea: NEGATIVE

## 2019-06-22 NOTE — Assessment & Plan Note (Addendum)
Patient does not use barrier protection/condoms.  Counseled patient on the need to use condoms during intercourse. Has remote hx of chlamydia.  - Follow up HIV and RPR

## 2019-06-22 NOTE — Assessment & Plan Note (Addendum)
Etiology uncertain. DDX allergic/irritant dermatitis, eczema, cellulitis, fungal infection.  Will treat with Kenalog ointment patient to follow up if not improving or if symptoms worsen.

## 2019-06-22 NOTE — Assessment & Plan Note (Signed)
Wet prep positive for BV.  Will treat with 500 mg TID course of Flagyl.  Advised patient not to drink EtOH while taking this medication.

## 2019-06-22 NOTE — Assessment & Plan Note (Signed)
Follow up lipid panel and CMP.  A1c obtained and was WNL.

## 2019-06-26 ENCOUNTER — Encounter: Payer: Self-pay | Admitting: Family Medicine

## 2019-06-26 NOTE — Telephone Encounter (Signed)
Spoke with pt. Made appt for 4/6 at 1:30 for labs that were not collected on last visit. Aquilla Solian, CMA

## 2019-06-27 ENCOUNTER — Other Ambulatory Visit (INDEPENDENT_AMBULATORY_CARE_PROVIDER_SITE_OTHER): Payer: Self-pay

## 2019-06-27 ENCOUNTER — Other Ambulatory Visit: Payer: Self-pay

## 2019-06-27 DIAGNOSIS — Z833 Family history of diabetes mellitus: Secondary | ICD-10-CM

## 2019-06-27 LAB — POCT GLYCOSYLATED HEMOGLOBIN (HGB A1C): Hemoglobin A1C: 5.3 % (ref 4.0–5.6)

## 2019-08-23 ENCOUNTER — Encounter: Payer: Self-pay | Admitting: Family Medicine

## 2019-08-23 ENCOUNTER — Other Ambulatory Visit: Payer: Self-pay

## 2019-08-23 ENCOUNTER — Ambulatory Visit (INDEPENDENT_AMBULATORY_CARE_PROVIDER_SITE_OTHER): Payer: Self-pay | Admitting: Family Medicine

## 2019-08-23 VITALS — BP 116/80

## 2019-08-23 DIAGNOSIS — N912 Amenorrhea, unspecified: Secondary | ICD-10-CM | POA: Insufficient documentation

## 2019-08-23 DIAGNOSIS — Z3202 Encounter for pregnancy test, result negative: Secondary | ICD-10-CM

## 2019-08-23 LAB — POCT GLYCOSYLATED HEMOGLOBIN (HGB A1C): Hemoglobin A1C: 5.3 % (ref 4.0–5.6)

## 2019-08-23 LAB — POCT URINE PREGNANCY: Preg Test, Ur: NEGATIVE

## 2019-08-23 NOTE — Assessment & Plan Note (Signed)
Patient with amenorrhea x2 months.  Does appear to be more of a chronic issue as she has had irregular periods throughout her life.  Also reports she has gone a year without a menses.  Urine pregnancy test here in clinic negative at home pregnancy test negative.  Given continued amenorrhea we will plan to get further work-up to find cause.  Will get A1c to rule out possible insulin resistance.  Can consider PCOS given hair growth so we will get FSH and LH.  Will get blood hCG as well to rule out pregnancy.  Will get estradiol level.  We will get TSH to rule out thyroid disorder especially given somewhat enlarged thyroid and history of family thyroid disorder.  We will also get prolactin.  Follow-up in 1 month.

## 2019-08-23 NOTE — Patient Instructions (Signed)

## 2019-08-23 NOTE — Progress Notes (Signed)
    SUBJECTIVE:   CHIEF COMPLAINT / HPI:   Amenorrhea  Patient reports not having menses since March 31st. Patient reports this has happened to her before, ~2016-2017. She has even gone a whole year without a period. Was told in the 8th grade to use birth control. Now not on any birth control. Patient is sexually active with 1 female partner. Last unprotected intercourse was within the past week. Took a pregnancy test yesterday and it was negative. Patient is considering having children with her boyfriend so does not want any treatments that would prevent pregnancy. No h/o PCOS. No headaches, vision changes, lactation. Normally has irregular periods. Periods are normal flow. Does report chin hair growth. Does note mother with h/o thyroid disease.   PERTINENT  PMH / PSH: obesity   OBJECTIVE:   BP 116/80   SpO2 99%   Gen: awake and alert, NAD HEENT: thyroid slightly enlarged but non tender Cardio: RRR, no MRG Resp: CTAB, no wheezes, rales or rhonchi GI: soft, non tender, non distended, bowel sounds present Ext: no edema   ASSESSMENT/PLAN:   Amenorrhea Patient with amenorrhea x2 months.  Does appear to be more of a chronic issue as she has had irregular periods throughout her life.  Also reports she has gone a year without a menses.  Urine pregnancy test here in clinic negative at home pregnancy test negative.  Given continued amenorrhea we will plan to get further work-up to find cause.  Will get A1c to rule out possible insulin resistance.  Can consider PCOS given hair growth so we will get FSH and LH.  Will get blood hCG as well to rule out pregnancy.  Will get estradiol level.  We will get TSH to rule out thyroid disorder especially given somewhat enlarged thyroid and history of family thyroid disorder.  We will also get prolactin.  Follow-up in 1 month.     Oralia Manis, DO [facility]

## 2019-08-24 ENCOUNTER — Encounter: Payer: Self-pay | Admitting: Family Medicine

## 2019-08-24 LAB — FOLLICLE STIMULATING HORMONE: FSH: 3.3 m[IU]/mL

## 2019-08-24 LAB — PROLACTIN: Prolactin: 13.6 ng/mL (ref 4.8–23.3)

## 2019-08-24 LAB — TSH: TSH: 1.75 u[IU]/mL (ref 0.450–4.500)

## 2019-08-24 LAB — ESTRADIOL: Estradiol: 188 pg/mL

## 2019-08-24 LAB — LUTEINIZING HORMONE: LH: 7.3 m[IU]/mL

## 2019-08-24 LAB — BETA HCG QUANT (REF LAB): hCG Quant: <1 m[IU]/mL

## 2019-08-25 ENCOUNTER — Encounter: Payer: Self-pay | Admitting: Family Medicine

## 2019-08-25 ENCOUNTER — Telehealth: Payer: Self-pay | Admitting: Family Medicine

## 2019-08-25 DIAGNOSIS — E282 Polycystic ovarian syndrome: Secondary | ICD-10-CM | POA: Insufficient documentation

## 2019-08-25 MED ORDER — METFORMIN HCL 500 MG PO TABS: ORAL_TABLET | ORAL | 1 refills | Status: DC

## 2019-08-25 NOTE — Telephone Encounter (Signed)
Lab work wnl. Given clinical signs of hyperandrogenism (chin hair growth), as well as other findings such as obesity, and irregular menses the diagnosis is likely PCOS. Discussed with Dr. Manson Passey.   Called patient and discussed above information. Will plan to initiate metformin. Will start at low dose and then increase until goal.   Per UTD dosing is as follows: Oligomenorrhea due to PCOS, treatment (alternative agent) (off-label use): Immediate release: Oral: Dosage range studied in trials: 1.5 to 2 g daily in 2 to 3 divided doses; to minimize GI adverse effects, most trials initiated therapy with 500 mg once or twice daily and gradually increased the dose in 500 mg increments every 7 days  Dynamed confirms this: Polycystic ovary syndrome 1500 to 2000 mg/day orally in divided doses (guideline dose)  Will initiate with 500mg  daily to avoid GI side effects. Will plan to increase to 500mg  bid in 1 week and then by 500mg  every week until at desired dose.   , PGY-3 [facility] 08/25/2019 8:21 AM

## 2019-09-04 ENCOUNTER — Encounter: Payer: Self-pay | Admitting: Family Medicine

## 2019-09-20 ENCOUNTER — Encounter: Payer: Self-pay | Admitting: Family Medicine

## 2019-09-20 ENCOUNTER — Other Ambulatory Visit: Payer: Self-pay

## 2019-09-20 ENCOUNTER — Ambulatory Visit (INDEPENDENT_AMBULATORY_CARE_PROVIDER_SITE_OTHER): Payer: Self-pay | Admitting: Family Medicine

## 2019-09-20 VITALS — BP 120/80 | HR 85 | Ht 64.0 in | Wt 194.0 lb

## 2019-09-20 DIAGNOSIS — N912 Amenorrhea, unspecified: Secondary | ICD-10-CM

## 2019-09-20 DIAGNOSIS — E282 Polycystic ovarian syndrome: Secondary | ICD-10-CM

## 2019-09-20 LAB — POCT URINE PREGNANCY: Preg Test, Ur: NEGATIVE

## 2019-09-20 NOTE — Patient Instructions (Addendum)
Polycystic Ovarian Syndrome  Polycystic ovarian syndrome (PCOS) is a common hormonal disorder among women of reproductive age. In most women with PCOS, many small fluid-filled sacs (cysts) grow on the ovaries, and the cysts are not part of a normal menstrual cycle. PCOS can cause problems with your menstrual periods and make it difficult to get pregnant. It can also cause an increased risk of miscarriage with pregnancy. If it is not treated, PCOS can lead to serious health problems, such as diabetes and heart disease. What are the causes? The cause of PCOS is not known, but it may be the result of a combination of certain factors, such as:  Irregular menstrual cycle.  High levels of certain hormones (androgens).  Problems with the hormone that helps to control blood sugar (insulin resistance).  Certain genes. What increases the risk? This condition is more likely to develop in women who have a family history of PCOS. What are the signs or symptoms? Symptoms of PCOS may include:  Multiple ovarian cysts.  Infrequent periods or no periods.  Periods that are too frequent or too heavy.  Unpredictable periods.  Inability to get pregnant (infertility) because of not ovulating.  Increased growth of hair on the face, chest, stomach, back, thumbs, thighs, or toes.  Acne or oily skin. Acne may develop during adulthood, and it may not respond to treatment.  Pelvic pain.  Weight gain or obesity.  Patches of thickened and dark brown or black skin on the neck, arms, breasts, or thighs (acanthosis nigricans).  Excess hair growth on the face, chest, abdomen, or upper thighs (hirsutism). How is this diagnosed? This condition is diagnosed based on:  Your medical history.  A physical exam, including a pelvic exam. Your health care provider may look for areas of increased hair growth on your skin.  Tests, such as: ? Ultrasound. This may be used to examine the ovaries and the lining of the  uterus (endometrium) for cysts. ? Blood tests. These may be used to check levels of sugar (glucose), female hormone (testosterone), and female hormones (estrogen and progesterone) in your blood. How is this treated? There is no cure for PCOS, but treatment can help to manage symptoms and prevent more health problems from developing. Treatment varies depending on:  Your symptoms.  Whether you want to have a baby or whether you need birth control (contraception). Treatment may include nutrition and lifestyle changes along with:  Progesterone hormone to start a menstrual period.  Birth control pills to help you have regular menstrual periods.  Medicines to make you ovulate, if you want to get pregnant.  Medicine to reduce excessive hair growth.  Surgery, in severe cases. This may involve making small holes in one or both of your ovaries. This decreases the amount of testosterone that your body produces. Follow these instructions at home:  Take over-the-counter and prescription medicines only as told by your health care provider.  Follow a healthy meal plan. This can help you reduce the effects of PCOS. ? Eat a healthy diet that includes lean proteins, complex carbohydrates, fresh fruits and vegetables, low-fat dairy products, and healthy fats. Make sure to eat enough fiber.  If you are overweight, lose weight as told by your health care provider. ? Losing 10% of your body weight may improve symptoms. ? Your health care provider can determine how much weight loss is best for you and can help you lose weight safely.  Keep all follow-up visits as told by your health care provider.   This is important. Contact a health care provider if:  Your symptoms do not get better with medicine.  You develop new symptoms. This information is not intended to replace advice given to you by your health care provider. Make sure you discuss any questions you have with your health care provider. Document  Revised: 02/19/2017 Document Reviewed: 08/25/2015 Elsevier Patient Education  2020 [employer].   Please considering restarting Metformin as this has helped you have periods In the past. You can consider the extended release Metformin as this will have less GI side effects. I have referred you to gynecology since you are trying to conceive. They should call you with an appointment time, if they do not call in the next few weeks please call us.

## 2019-09-20 NOTE — Assessment & Plan Note (Signed)
Patient with recent diagnosis of PCOS. Did have menses with Metformin however did not tolerate Metformin secondary to GI upset. Is not willing to try extended release at this time. Given that patient is trying to conceive will send referral to gynecology for further management. Advised to follow-up as needed with [facility].

## 2019-09-20 NOTE — Progress Notes (Signed)
    SUBJECTIVE:   CHIEF COMPLAINT / HPI:   Amenorrhea Patient recently seen on 6/2 for amenorrhea. At that time lab work was obtained showing diagnosis of PCOS. Patient was placed on Metformin and advised to increase to a total dose of 1.5 g a day.  Since last f/u patient reports GI upset from metformin. When she increased to 1g in AM and 500mg  in PM she began to have vomiting. Had worsening GERD as well. Stopped taking the metformin last month and has not had issues since.   Patient does report having menses while on metformin. Since then no periods. Menses was on 6/8. Lasted for 5 days. Normal flow.   Patient is interested in conceiving. Patient is sexually active. Most recent intercourse was yesterday, unprotected.   PERTINENT  PMH / PSH: PCOS, obesity   OBJECTIVE:   BP 120/80   Pulse 85   Ht 5\' 4"  (1.626 m)   Wt 194 lb (88 kg)   LMP 08/29/2019 (Exact Date)   SpO2 98%   BMI 33.30 kg/m   Gen: awake and alert, NAD Cardio: RRR, no MRG Resp: CTAB, no wheezes, rales or rhonchi GI: soft, non tender, non distended, bowel sounds present Ext: no edema   ASSESSMENT/PLAN:   PCOS (polycystic ovarian syndrome) Patient with recent diagnosis of PCOS. Did have menses with Metformin however did not tolerate Metformin secondary to GI upset. Is not willing to try extended release at this time. Given that patient is trying to conceive will send referral to gynecology for further management. Advised to follow-up as needed with [facility].     10/29/2019, DO [facility]

## 2019-11-30 ENCOUNTER — Ambulatory Visit (INDEPENDENT_AMBULATORY_CARE_PROVIDER_SITE_OTHER): Payer: Self-pay | Admitting: Obstetrics and Gynecology

## 2019-11-30 ENCOUNTER — Other Ambulatory Visit: Payer: Self-pay

## 2019-11-30 ENCOUNTER — Encounter: Payer: Self-pay | Admitting: Obstetrics and Gynecology

## 2019-11-30 VITALS — BP 114/75 | HR 81 | Wt 186.2 lb

## 2019-11-30 DIAGNOSIS — Z6831 Body mass index (BMI) 31.0-31.9, adult: Secondary | ICD-10-CM

## 2019-11-30 DIAGNOSIS — E282 Polycystic ovarian syndrome: Secondary | ICD-10-CM

## 2019-11-30 DIAGNOSIS — E669 Obesity, unspecified: Secondary | ICD-10-CM

## 2019-11-30 MED ORDER — METFORMIN HCL 500 MG PO TABS: ORAL_TABLET | ORAL | 5 refills | Status: DC

## 2019-11-30 NOTE — Progress Notes (Signed)
   Subjective:    Patient ID: Linda Nunez is a 24 y.o. female presenting with PCOS  on 11/30/2019  HPI: Pt seen at [facility].  She was diagnosed with PCOS, but was unsure of her current treatment options and etiology of the disease.  Discussed insulin resistance and her upper normal hgb A1c.  She was placed on metformin previously but stopped taking it due to mild GI disturbance.  Discussed that this medication was used to decrease insulin resistance and improve the chance of ovulation and regular cycles.    Review of Systems    Objective:    BP 114/75   Pulse 81   Wt 186 lb 3.2 oz (84.5 kg)   LMP 10/30/2019 (Exact Date)   BMI 31.96 kg/m  Physical Exam      Assessment & Plan:   Polycystic ovarian disorder Etiology and treatment discussed. Discussed continuing metformin, starting OCP for cycle control and 10% weight loss.   The patient will pursue restarting metformin and trying to lose 15-18 pounds. Total face-to-face time with patient: 30 minutes. Over 50% of encounter was spent on counseling and coordination of care.   Return in about 4 months (around 03/31/2020) for follow .  Warden Fillers 11/30/2019 4:48 PM

## 2019-11-30 NOTE — Patient Instructions (Signed)
Diet for Polycystic Ovary Syndrome Polycystic ovary syndrome (PCOS) is a disorder of the chemicals (hormones) that regulate a woman's reproductive system, including monthly periods (menstruation). The condition causes important hormones to be out of balance. PCOS can:  Stop your periods or make them irregular.  Cause cysts to develop on your ovaries.  Make it difficult to get pregnant.  Stop your body from responding to the effects of insulin (insulin resistance). Insulin resistance can lead to obesity and diabetes. Changing what you eat can help you manage PCOS and improve your health. Following a balanced diet can help you lose weight and improve the way that your body uses insulin. What are tips for following this plan?  Follow a balanced diet for meals and snacks. Eat breakfast, lunch, dinner, and one or two snacks every day.  Include protein in each meal and snack.  Choose whole grains instead of products that are made with refined flour.  Eat a variety of foods.  Exercise regularly as told by your health care provider. Aim to do 30 or more minutes of exercise on most days of the week.  If you are overweight or obese: ? Pay attention to how many calories you eat. Cutting down on calories can help you lose weight. ? Work with your health care provider or a diet and nutrition specialist (dietitian) to figure out how many calories you need each day. What foods can I eat?  Fruits Include a variety of colors and types. All fruits are helpful for PCOS. Vegetables Include a variety of colors and types. All vegetables are helpful for PCOS. Grains Whole grains, such as whole wheat. Whole-grain breads, crackers, cereals, and pasta. Unsweetened oatmeal, bulgur, barley, quinoa, and brown rice. Tortillas made from corn or whole-wheat flour. Meats and other proteins Low-fat (lean) proteins, such as fish, chicken, beans, eggs, and tofu. Dairy Low-fat dairy products, such as skim milk,  cheese sticks, and yogurt. Beverages Low-fat or fat-free drinks, such as water, low-fat milk, sugar-free drinks, and small amounts of 100% fruit juice. Seasonings and condiments Ketchup. Mustard. Barbecue sauce. Relish. Low-fat or fat-free mayonnaise. Fats and oils Olive oil or canola oil. Walnuts and almonds. The items listed above may not be a complete list of recommended foods and beverages. Contact a dietitian for more options. What foods are not recommended? Foods that are high in calories or fat. Fried foods. Sweets. Products that are made from refined white flour, including white bread, pastries, white rice, and pasta. The items listed above may not be a complete list of foods and beverages to avoid. Contact a dietitian for more information. Summary  PCOS is a hormonal imbalance that affects a woman's reproductive system.  You can help to manage your PCOS by exercising regularly and eating a healthy, varied diet of vegetables, fruit, whole grains, low-fat (lean) protein, and low-fat dairy products.  Changing what you eat can improve the way that your body uses insulin, help your hormones reach normal levels, and help you lose weight. This information is not intended to replace advice given to you by your health care provider. Make sure you discuss any questions you have with your health care provider. Document Revised: 06/29/2018 Document Reviewed: 01/11/2017 Elsevier Patient Education  2020 [employer] Polycystic Ovarian Syndrome  Polycystic ovarian syndrome (PCOS) is a common hormonal disorder among women of reproductive age. In most women with PCOS, many small fluid-filled sacs (cysts) grow on the ovaries, and the cysts are not part of a normal menstrual cycle.   PCOS can cause problems with your menstrual periods and make it difficult to get pregnant. It can also cause an increased risk of miscarriage with pregnancy. If it is not treated, PCOS can lead to serious health problems,  such as diabetes and heart disease. What are the causes? The cause of PCOS is not known, but it may be the result of a combination of certain factors, such as:  Irregular menstrual cycle.  High levels of certain hormones (androgens).  Problems with the hormone that helps to control blood sugar (insulin resistance).  Certain genes. What increases the risk? This condition is more likely to develop in women who have a family history of PCOS. What are the signs or symptoms? Symptoms of PCOS may include:  Multiple ovarian cysts.  Infrequent periods or no periods.  Periods that are too frequent or too heavy.  Unpredictable periods.  Inability to get pregnant (infertility) because of not ovulating.  Increased growth of hair on the face, chest, stomach, back, thumbs, thighs, or toes.  Acne or oily skin. Acne may develop during adulthood, and it may not respond to treatment.  Pelvic pain.  Weight gain or obesity.  Patches of thickened and dark brown or black skin on the neck, arms, breasts, or thighs (acanthosis nigricans).  Excess hair growth on the face, chest, abdomen, or upper thighs (hirsutism). How is this diagnosed? This condition is diagnosed based on:  Your medical history.  A physical exam, including a pelvic exam. Your health care provider may look for areas of increased hair growth on your skin.  Tests, such as: ? Ultrasound. This may be used to examine the ovaries and the lining of the uterus (endometrium) for cysts. ? Blood tests. These may be used to check levels of sugar (glucose), female hormone (testosterone), and female hormones (estrogen and progesterone) in your blood. How is this treated? There is no cure for PCOS, but treatment can help to manage symptoms and prevent more health problems from developing. Treatment varies depending on:  Your symptoms.  Whether you want to have a baby or whether you need birth control (contraception). Treatment may  include nutrition and lifestyle changes along with:  Progesterone hormone to start a menstrual period.  Birth control pills to help you have regular menstrual periods.  Medicines to make you ovulate, if you want to get pregnant.  Medicine to reduce excessive hair growth.  Surgery, in severe cases. This may involve making small holes in one or both of your ovaries. This decreases the amount of testosterone that your body produces. Follow these instructions at home:  Take over-the-counter and prescription medicines only as told by your health care provider.  Follow a healthy meal plan. This can help you reduce the effects of PCOS. ? Eat a healthy diet that includes lean proteins, complex carbohydrates, fresh fruits and vegetables, low-fat dairy products, and healthy fats. Make sure to eat enough fiber.  If you are overweight, lose weight as told by your health care provider. ? Losing 10% of your body weight may improve symptoms. ? Your health care provider can determine how much weight loss is best for you and can help you lose weight safely.  Keep all follow-up visits as told by your health care provider. This is important. Contact a health care provider if:  Your symptoms do not get better with medicine.  You develop new symptoms. This information is not intended to replace advice given to you by your health care provider. Make sure you   discuss any questions you have with your health care provider. Document Revised: 02/19/2017 Document Reviewed: 08/25/2015 Elsevier Patient Education  2020 [employer] Inc[employer]  

## 2019-12-02 ENCOUNTER — Encounter: Payer: Self-pay | Admitting: Family Medicine

## 2020-03-23 NOTE — L&D Delivery Note (Signed)
OB/GYN Faculty Practice Delivery Note  Linda Nunez is a 25 y.o. G1P0 s/p SVD at [redacted]w[redacted]d. She was admitted for IOL.   ROM: 10h 19m with clear fluid GBS Status: Positive/-- (10/03 1226) Maximum Maternal Temperature: 98.6  Labor Progress: Patient presented to L&D for IOL for PROM. Initial SVE: 2/30/-3. She then progressed to complete.   Delivery Date/Time: 01/10/21 @1016  Delivery: Called to room and patient was complete and pushing. Head delivered LOA. No nuchal cord present. Shoulder and body delivered in usual fashion. Infant with spontaneous cry, placed on mother's abdomen, dried and stimulated. Cord clamped x 2 after 1-minute delay, and cut by FOB. Cord blood drawn. Placenta delivered spontaneously with gentle cord traction. Fundus firm with massage and Pitocin. Labia, perineum, vagina, and cervix inspected. Right perineal 1st degree laceration repaired with 2 simple sutures of 3-0 Vicryl. Left periurethral abrasion repaired with 4-0 Vicryl. Placenta was examined after and found to be intact with normal 3-vessel cord.   Placenta: Intact Complications: None Lacerations: 1st degree right perineal EBL: Analgesia: 1% Lidocaine  Postpartum Planning [ ]  message to sent to schedule follow-up  [ ]  vaccines UTD  Infant: APGARs 8/9  pending weight  , DO 01/10/2021, 10:48 AM PGY-1, [facility]

## 2020-05-17 ENCOUNTER — Ambulatory Visit: Payer: Medicaid Other | Admitting: Family Medicine

## 2020-05-28 ENCOUNTER — Ambulatory Visit (INDEPENDENT_AMBULATORY_CARE_PROVIDER_SITE_OTHER): Payer: Self-pay | Admitting: Family Medicine

## 2020-05-28 ENCOUNTER — Other Ambulatory Visit: Payer: Self-pay

## 2020-05-28 VITALS — BP 118/70 | HR 101

## 2020-05-28 DIAGNOSIS — R1110 Vomiting, unspecified: Secondary | ICD-10-CM

## 2020-05-28 DIAGNOSIS — N926 Irregular menstruation, unspecified: Secondary | ICD-10-CM

## 2020-05-28 DIAGNOSIS — Z3A01 Less than 8 weeks gestation of pregnancy: Secondary | ICD-10-CM

## 2020-05-28 DIAGNOSIS — Z3201 Encounter for pregnancy test, result positive: Secondary | ICD-10-CM | POA: Diagnosis not present

## 2020-05-28 LAB — POCT URINE PREGNANCY: Preg Test, Ur: POSITIVE — AB

## 2020-05-28 MED ORDER — PRENATAL VITAMIN 27-0.8 MG PO TABS: ORAL_TABLET | ORAL | 10 refills | Status: DC

## 2020-05-28 MED ORDER — DOXYLAMINE-PYRIDOXINE 10-10 MG PO TBEC: DELAYED_RELEASE_TABLET | ORAL | 1 refills | Status: DC

## 2020-05-28 NOTE — Patient Instructions (Addendum)
It was nice to see you today,  Congratulations on your pregnancy. We have ordered some blood tests and an ultrasound. Someone will call you when the ultrasound is scheduled. We have scheduled your initial OB visit.  For your nausea and vomiting, I have prescribed a medication called diclegis. Please take it as prescribed.  If you have any questions please call our office.  Have a great day,  Frederic Jericho, MD

## 2020-05-28 NOTE — Progress Notes (Signed)
    SUBJECTIVE:   CHIEF COMPLAINT / HPI:   Vomiting: Patient complains of 2 weeks of vomiting when she wakes up in the morning.  States the vomit looks like "what she eats".  Usually vomits less than 3 times a day.  Improves as the day goes on.  Decreased appetite due to nausea.  She is sexually active with one female partner.  They do not use condoms and she is not on birth control.  Her last menstrual cycle was January 21.  She is not currently taking Metformin.  She was taking it for PCOS.  Today she took some nausea medication she got from the store but this did not help.  Otherwise she is not taking any medications.  After talking about patient's positive pregnancy test with her, she seems surprised at the results.  States that she is in a stable relationship and the father of the baby would like to have a child.  She is also okay with being pregnant and is not considering elective abortion.  She has not been pregnant previously.  She has a lot of questions about what she should do next.  We discussed her initial prenatal visit.  PERTINENT  PMH / PSH: PCOS  OBJECTIVE:   BP 118/70   Pulse (!) 101   LMP 04/12/2020   SpO2 98%   General: Alert and oriented.  No acute distress. CV: Regular rate and rhythm, no murmurs Pulmonary: Clear auscultation bilaterally GI: Soft, nontender. Psych: Pleasant affect.  Does not appear upset by the news of her pregnancy.  ASSESSMENT/PLAN:   Pregnancy Patient's pregnancy test was positive today.  Based on last menstrual period of 04/12/2020 she is close to [redacted] weeks gestational age.  Discussed with patient her next steps which include getting blood work today, scheduling an initial OB visit, and getting an ultrasound.  Also prescribed prenatal vitamins and Diclegis for patient to help with her vomiting.       Sandre Kitty, MD [facility]

## 2020-05-29 ENCOUNTER — Encounter: Payer: Self-pay | Admitting: Family Medicine

## 2020-05-29 LAB — OBSTETRIC PANEL, INCLUDING HIV
Antibody Screen: NEGATIVE
Basophils Absolute: 0 10*3/uL (ref 0.0–0.2)
Basos: 0 %
EOS (ABSOLUTE): 0.1 10*3/uL (ref 0.0–0.4)
Eos: 1 %
HIV Screen 4th Generation wRfx: NONREACTIVE
Hematocrit: 40.6 % (ref 34.0–46.6)
Hemoglobin: 13.6 g/dL (ref 11.1–15.9)
Hepatitis B Surface Ag: NEGATIVE
Immature Grans (Abs): 0 10*3/uL (ref 0.0–0.1)
Immature Granulocytes: 0 %
Lymphocytes Absolute: 3.3 10*3/uL — ABNORMAL HIGH (ref 0.7–3.1)
Lymphs: 40 %
MCH: 31.7 pg (ref 26.6–33.0)
MCHC: 33.5 g/dL (ref 31.5–35.7)
MCV: 95 fL (ref 79–97)
Monocytes Absolute: 0.6 10*3/uL (ref 0.1–0.9)
Monocytes: 8 %
Neutrophils Absolute: 4.2 10*3/uL (ref 1.4–7.0)
Neutrophils: 51 %
Platelets: 341 10*3/uL (ref 150–450)
RBC: 4.29 x10E6/uL (ref 3.77–5.28)
RDW: 12.3 % (ref 11.7–15.4)
RPR Ser Ql: NONREACTIVE
Rh Factor: POSITIVE
Rubella Antibodies, IGG: 3.05 index (ref >0.99)
WBC: 8.2 10*3/uL (ref 3.4–10.8)

## 2020-05-29 LAB — HCV AB W REFLEX TO QUANT PCR: HCV Ab: 0.2 s/co ratio (ref 0.0–0.9)

## 2020-05-29 LAB — HCV INTERPRETATION

## 2020-05-30 ENCOUNTER — Encounter (HOSPITAL_COMMUNITY): Payer: Self-pay | Admitting: *Deleted

## 2020-05-30 ENCOUNTER — Other Ambulatory Visit: Payer: Self-pay

## 2020-05-30 ENCOUNTER — Inpatient Hospital Stay (HOSPITAL_COMMUNITY)
Admission: EM | Admit: 2020-05-30 | Discharge: 2020-05-30 | Disposition: A | Discharge status: Home / Self Care | Payer: Medicaid Other | Attending: Obstetrics & Gynecology | Admitting: Obstetrics & Gynecology

## 2020-05-30 DIAGNOSIS — O211 Hyperemesis gravidarum with metabolic disturbance: Secondary | ICD-10-CM | POA: Diagnosis not present

## 2020-05-30 DIAGNOSIS — O219 Vomiting of pregnancy, unspecified: Secondary | ICD-10-CM

## 2020-05-30 DIAGNOSIS — F12188 Cannabis abuse with other cannabis-induced disorder: Secondary | ICD-10-CM

## 2020-05-30 DIAGNOSIS — Z3A09 9 weeks gestation of pregnancy: Secondary | ICD-10-CM | POA: Diagnosis not present

## 2020-05-30 DIAGNOSIS — Z87891 Personal history of nicotine dependence: Secondary | ICD-10-CM | POA: Insufficient documentation

## 2020-05-30 DIAGNOSIS — O210 Mild hyperemesis gravidarum: Secondary | ICD-10-CM | POA: Diagnosis present

## 2020-05-30 HISTORY — DX: Personal history of other diseases of the female genital tract: Z87.42

## 2020-05-30 LAB — HGB FRACTIONATION CASCADE
Hgb A2: 2.8 % (ref 1.8–3.2)
Hgb A: 97.2 % (ref 96.4–98.8)
Hgb F: 0 % (ref 0.0–2.0)
Hgb S: 0 %

## 2020-05-30 LAB — URINALYSIS, ROUTINE W REFLEX MICROSCOPIC
Bilirubin Urine: NEGATIVE
Glucose, UA: NEGATIVE mg/dL
Hgb urine dipstick: NEGATIVE
Ketones, ur: 80 mg/dL — AB
Leukocytes,Ua: NEGATIVE
Nitrite: NEGATIVE
Protein, ur: 100 mg/dL — AB
Specific Gravity, Urine: 1.032 — ABNORMAL HIGH (ref 1.005–1.030)
pH: 5 (ref 5.0–8.0)

## 2020-05-30 LAB — URINE CULTURE, OB REFLEX

## 2020-05-30 LAB — CULTURE, OB URINE

## 2020-05-30 MED ORDER — PROMETHAZINE HCL 25 MG/ML IJ SOLN
25.0000 mg | Freq: Once | INTRAMUSCULAR | Status: AC
  Administered 2020-05-30: 25 mg via INTRAVENOUS
  Filled 2020-05-30: qty 1

## 2020-05-30 MED ORDER — ONDANSETRON 8 MG PO TBDP: 8.0000 mg | ORAL_TABLET | Freq: Three times a day (TID) | ORAL | 0 refills | Status: DC | PRN

## 2020-05-30 MED ORDER — LACTATED RINGERS IV BOLUS
1000.0000 mL | Freq: Once | INTRAVENOUS | Status: AC
  Administered 2020-05-30: 1000 mL via INTRAVENOUS

## 2020-05-30 MED ORDER — FAMOTIDINE IN NACL 20-0.9 MG/50ML-% IV SOLN
20.0000 mg | Freq: Once | INTRAVENOUS | Status: AC
  Administered 2020-05-30: 20 mg via INTRAVENOUS
  Filled 2020-05-30: qty 50

## 2020-05-30 MED ORDER — PROMETHAZINE HCL 12.5 MG PO TABS: 12.5000 mg | ORAL_TABLET | Freq: Four times a day (QID) | ORAL | 0 refills | Status: DC | PRN

## 2020-05-30 NOTE — Discharge Instructions (Signed)
Cannabis Use Disorder Cannabis use disorder occurs when marijuana use disrupts a person's daily life or causes health problems. This condition can be dangerous. The health problems this condition can cause include:  Long-lasting problems with thinking and learning. These can be permanent in young people.  Mental health problems, such as severe anxiety, paranoia, hallucinations, or schizophrenia.  Dangerously high blood pressure and heart rate.  Breathing problems.  Problems with child development during and after pregnancy. People with this condition are also more likely to use other drugs. What are the causes? This condition is caused by using marijuana too much over time. It is not caused by using it only once in a while. Many people with this condition use marijuana because it gives them a feeling of extreme pleasure or relaxation. What increases the risk? This condition is more likely to develop in:  Men.  People with a family history of cannabis use disorder.  People with mental health issues such as depression or post-traumatic stress disorder (PTSD). What are the signs or symptoms? Symptoms of this condition include: Addiction  Using greater amounts of marijuana than you want to, or using marijuana for longer than you want to.  Craving marijuana.  Spending a lot of time getting marijuana, using it, or recovering from its effects.  Having problems at work, at school, at home, or in relationships because of marijuana use.  Giving up or cutting down on important life activities because of marijuana use.  Using marijuana at times when it is dangerous, such as while you are driving a car.  Needing more and more marijuana to get the same desired effect (building up a tolerance).  Lack of motivation, known as amotivational syndrome, which leads to poor school and work performance. Physical problems  A long-lasting cough.  Bronchitis.  Emphysema.  Throat and lung  cancer. Mental problems  Psychosis.  Anxiety.  Trouble sleeping.  Increase in violent behavior in young people. Withdrawal problems You may have symptoms when you stop using marijuana. Symptoms include:  Irritability or anger.  Anxiety or restlessness.  Trouble sleeping.  Loss of appetite or weight loss.  Aches and pains.  Shakiness, sweating, or chills. How is this diagnosed? This condition is diagnosed with an assessment. During the assessment, your health care provider will ask about your marijuana use and how it affects your life. You will be diagnosed with the condition if you have had at least two symptoms of this condition within a 12-month period. How severe the condition is depends on how many symptoms you have.  If you have two to three symptoms, your condition is mild.  If you have four to five symptoms, your condition is moderate.  If you have six or more symptoms, your condition is severe. Your health care provider may perform a physical exam or do lab tests to see if you have physical problems resulting from marijuana use. Your health care provider may also screen for drug use and refer you to a mental health professional for evaluation. How is this treated? Treatment for this condition is usually provided by mental health professionals with training in substance use disorders. Your treatment may involve:  Counseling. This treatment is also called talk therapy. It is provided by substance use treatment counselors. A counselor can address the reasons you use marijuana and suggest ways to keep you from using it again. The goals of talk therapy are to: ? Find healthy activities to replace using marijuana. ? Identify and avoid the things   that trigger your marijuana use. ? Help you learn how to handle cravings.  Support groups. Support groups are led by people who have quit using marijuana. They provide emotional support, advice, and guidance.  Medicine. Medicine  is used to treat mental health issues that trigger marijuana use or that result from it.   Follow these instructions at home: Lifestyle Make healthy lifestyle choices, such as:  Eating a healthy diet.  Getting enough exercise.  Improving your stress-management skills.   General instructions  Take over-the-counter, prescription medicines, and herbal remedies only as told by your health care provider.  Check with your health care provider before starting any new medicines.  Work with Photographer or group to develop tools to keep you from using marijuana again (relapsing).  Learn daily living skills and work Programmer, applications.  Keep all follow-up visits as told by your health care provider. This is important. Where to find more information  [employer] on Drug Abuse: http://www.price-smith.com/  Centers for Disease Control and Prevention: FootballExhibition.com.br  Substance Abuse and Mental Health Services Administration: SkateOasis.com.pt Contact a health care provider if:  You are not able to take your medicines as told.  Your symptoms get worse. Get help right away if:  You have serious thoughts about hurting yourself or others. If you ever feel like you may hurt yourself or others, or have thoughts about taking your own life, get help right away. You can go to your nearest emergency department or call:  Your local emergency services (911 in the [place]  A suicide crisis helpline, such as the National Suicide Prevention Lifeline at 567-730-8355. This is open 24 hours a day in the [place] Summary  Cannabis use disorder is when using marijuana disrupts a person's daily life or causes health problems.  This condition is caused by using marijuana too much over time.  Treatment for this condition is usually provided by mental health professionals with training in substance use disorders.  Treatment may involve counseling, support groups, or medicine. This information is not intended to replace advice  given to you by your health care provider. Make sure you discuss any questions you have with your health care provider. Document Revised: 02/07/2019 Document Reviewed: 02/07/2019 Elsevier Patient Education  2021 [employer].

## 2020-05-30 NOTE — MAU Provider Note (Addendum)
History     [id number]  Arrival date and time: 05/30/20 1025    Chief Complaint  Patient presents with  . Emesis  . Nausea     HPI Linda Nunez is a 25 y.o. G1P0 at [redacted]w[redacted]d by LMP with PMHx notable for PCOS, who presents to the MAU for nausea and vomiting in pregnancy. She has had these symptoms for two weeks. She vomits about once every 5 minutes and feels dehydrated. She reports she has a brown discharge and vaginal cramping. She is unable to tolerate food. She denies abdominal pain, fullness, diarrhea, constipation, fever, headaches or dietary changes.  Linda Nunez reports that on 04/20/2021 (her birthday) she drank heavily and threw up. She also took a "Pussycat pill" that night. She endorses marijuana use. She also claims that the food at her job was giving her diarrhea in February, so she stopped eating it. Her diet consists mainly of fast food and takeout. She quit her job in February after having 2 anxiety attacks. On 05/28/2020, she presented to [facility] complaining of hematemesis. There, she had a positive urine pregnancy test. She reports that she has stopped drinking alcohol and using marijuana since she found out she was pregnant. Today, she presented to the ED complaining of nausea and vomiting. She took Alka-Seltzer this morning but threw it up. She has been taking Nausea Relief for the past 2 days.   Vaginal bleeding: No LOF: No Fetal Movement: No Contractions: No  O/Positive/-- (03/08 1658)   Past Medical History:  Diagnosis Date  . Chlamydia   . History of PCOS   . PCOS (polycystic ovarian syndrome)     Past Surgical History:  Procedure Laterality Date  . NO PAST SURGERIES      Family History  Problem Relation Age of Onset  . Asthma Mother   . Diabetes Father     Social History   Socioeconomic History  . Marital status: Single    Spouse name: Not on file  . Number of children: Not on file  . Years of education: Not on file  .  Highest education level: Not on file  Occupational History  . Not on file  Tobacco Use  . Smoking status: Former Smoker    Types: Cigarettes  . Smokeless tobacco: Never Used  Vaping Use  . Vaping Use: Former  Substance and Sexual Activity  . Alcohol use: Yes    Comment: social   . Drug use: Yes    Frequency: 7.0 times per week    Types: Marijuana    Comment: 05/28/2020  . Sexual activity: Yes    Birth control/protection: None  Other Topics Concern  . Not on file  Social History Narrative  . Not on file   Social Determinants of Health   Financial Resource Strain: Not on file  Food Insecurity: Not on file  Transportation Needs: Not on file  Physical Activity: Not on file  Stress: Not on file  Social Connections: Not on file  Intimate Partner Violence: Not on file    Allergies  Allergen Reactions  . Latex Itching and Rash    No current facility-administered medications on file prior to encounter.   Current Outpatient Medications on File Prior to Encounter  Medication Sig Dispense Refill  . Calcium Carbonate Antacid (ALKA-SELTZER ANTACID PO) Take 1 tablet by mouth once.    . Doxylamine-Pyridoxine (DICLEGIS) 10-10 MG TBEC Initial, 2 tablets orally at bedtime on day 1 and 2; if symptoms persist,  take 1 tablet in morning and 2 tablets at bedtime on day 3; if symptoms persist, may increase to MAX 4 tablets per day, administered as 1 tablet in the morning, 1 tablet in mid-afternoon and 2 tablets at bedtime 60 tablet 1  . metFORMIN (GLUCOPHAGE) 500 MG tablet Week 1 take 500mg  daily; Week 2 increase to 500mg  twice daily; Week 3 increase to 1000mg  in the AM and then 500mg  in the PM 90 tablet 5  . Prenatal Vit-Fe Fumarate-FA (PRENATAL VITAMIN) 27-0.8 MG TABS Take once daily 30 tablet 10     ROS Pertinent positives and negative per HPI, all others reviewed and negative  Physical Exam   BP 106/62   Pulse 98   Temp 99.1 F (37.3 C) (Oral)   Resp 17   Ht 5\' 4"  (1.626 m)    Wt 77.1 kg   LMP 03/23/2020   SpO2 100%   BMI 29.18 kg/m   Physical Exam General: normal appearance Heart: regular rate Lungs: normal respiratory effort Abdomen: abdomen soft and non-tender  Labs No results found for this or any previous visit (from the past 24 hour(s)).  Imaging No results found.  MAU Course  Procedures  Lab Orders     Urinalysis, Routine w reflex microscopic Urine, Clean Catch Meds ordered this encounter  Medications  . lactated ringers bolus 1,000 mL  . famotidine (PEPCID) IVPB 20 mg premix  . promethazine (PHENERGAN) injection 25 mg  . lactated ringers bolus 1,000 mL  . promethazine (PHENERGAN) 12.5 MG tablet    Sig: Take 1-2 tablets (12.5-25 mg total) by mouth every 6 (six) hours as needed for nausea or vomiting.    Dispense:  30 tablet    Refill:  0    Order Specific Question:   Supervising Provider    Answer:   A [[id number]]  . ondansetron (ZOFRAN ODT) 8 MG disintegrating tablet    Sig: Take 1 tablet (8 mg total) by mouth every 8 (eight) hours as needed for nausea or vomiting.    Dispense:  20 tablet    Refill:  0    Order Specific Question:   Supervising Provider    Answer:   A [[id number]]   Imaging Orders  No imaging studies ordered today     Assessment and Plan  Linda Nunez is a 25 y.o. G1P0 at [redacted]w[redacted]d by LMP with PMHx notable for PCOS, who presents for nausea and vomiting in pregnancy. The most likely diagnosis is cannabis hyperemesis syndrome.  #Routine prenatal care: avoid alcohol and drug use #Cannabis hyperemesis syndrome: abstain from marijuana #Nausea/Vomiting: treat with phenergan and pepcid  #Dehydration: regular fluid intake  Linda Nunez Linda Nunez.   I have seen and examined this patient and agree with above documentation in the Medical students note.   Linda Nunez is a 25 y.o. G1P0 female reporting abdominal pain, nausea and vomiting unrelieved by over  the counter nausea medication and marijuana. She reports vomiting multiple times per day, every 5 minutes. She reports using marijuana to help with her nausea. She reports the marijuana helps and hot showers help.   Associated symptoms:  Neg fever and chills Neg abdominal pain Neg vaginal bleeding Neg vaginal discharge Neg urinary complaints + GI complaints  PE: No data found. Gen: calm comfortable, NAD Resp: normal effort, no distress Heart: Regular rate Abd: Soft, NT, Pos BS x 4 Neuro: A&O x 4 Pelvic exam: deferred   ROS,  labs, PMH reviewed  Orders Placed This Encounter  Procedures  . Urinalysis, Routine w reflex microscopic Urine, Clean Catch  . Discharge patient   Meds ordered this encounter  Medications  . lactated ringers bolus 1,000 mL  . famotidine (PEPCID) IVPB 20 mg premix  . promethazine (PHENERGAN) injection 25 mg  . lactated ringers bolus 1,000 mL  . promethazine (PHENERGAN) 12.5 MG tablet    Sig: Take 1-2 tablets (12.5-25 mg total) by mouth every 6 (six) hours as needed for nausea or vomiting.    Dispense:  30 tablet    Refill:  0    Order Specific Question:   Supervising Provider    Answer:   Linda Nunez A [[id number]]  . ondansetron (ZOFRAN ODT) 8 MG disintegrating tablet    Sig: Take 1 tablet (8 mg total) by mouth every 8 (eight) hours as needed for nausea or vomiting.    Dispense:  20 tablet    Refill:  0    Order Specific Question:   Supervising Provider    Answer:   Linda Nunez A [[id number]]    MDM   Assessment 1. Nausea and vomiting in pregnancy   2. Cannabis hyperemesis syndrome concurrent with and due to cannabis abuse (HCC)   3. [redacted] weeks gestation of pregnancy     Plan: - Discharge home in stable condition. - Follow-up as scheduled at your doctor's office or sooner as needed if symptoms worsen. - Return to maternity admissions symptoms worsen - Stop smoking marijuana.  - Zofran and phenergan   Linda Nunez, Linda Rutherford, NP 06/03/2020 6:23  PM

## 2020-05-30 NOTE — ED Triage Notes (Signed)
C/o nausea and vomiting , G! P0 states she had a positive preg. Test 3/8 at [facility].

## 2020-05-30 NOTE — ED Provider Notes (Signed)
Emergency Medicine Provider OB Triage Evaluation Note  Linda Nunez is a 25 y.o. female, G1P0, at approximately [redacted] weeks gestation who presents to the emergency department with complaints of nausea and vomiting.  She has a positive pregnancy test documented from 05/28/2020.  She denies any vaginal bleeding/discharge.  No chest pain or shortness of breath..  Review of  Systems  Positive: For nausea and vomiting Negative: Vaginal bleeding, pelvic pain, diarrhea, chest pain  Physical Exam  BP 134/85 (BP Location: Left Arm)   Pulse 89   Temp 99.1 F (37.3 C) (Oral)   Resp 18   Ht 5\' 4"  (1.626 m)   Wt 86.2 kg   SpO2 98%   BMI 32.61 kg/m  General: Awake, no distress  HEENT: Atraumatic  Resp: Normal effort  Cardiac: Normal rate Abd: Nondistended, nontender  MSK: Moves all extremities without difficulty Neuro: Speech clear  Medical Decision Making  Pt evaluated for pregnancy concern and is stable for transfer to MAU. Pt is in agreement with plan for transfer.  10:59 AM Discussed with MAU APP who accepts patient in transfer.  Clinical Impression  Nausea and vomiting    , DO 05/30/20 1104

## 2020-05-30 NOTE — MAU Note (Signed)
Pt states she feels better. No further nausea or vomiting in the last hour. Has tolerated ice chips without N/V

## 2020-05-30 NOTE — MAU Note (Signed)
Pt reports she has had n/v for about a week . Not able to eat everything comes back up.  Denies any pain at this time. Reports she has a brown to pink vag discharge.

## 2020-05-31 DIAGNOSIS — Z3490 Encounter for supervision of normal pregnancy, unspecified, unspecified trimester: Secondary | ICD-10-CM | POA: Insufficient documentation

## 2020-05-31 DIAGNOSIS — Z3400 Encounter for supervision of normal first pregnancy, unspecified trimester: Secondary | ICD-10-CM | POA: Insufficient documentation

## 2020-05-31 NOTE — Assessment & Plan Note (Addendum)
Patient's pregnancy test was positive today.  Based on last menstrual period of 04/12/2020 she is close to [redacted] weeks gestational age.  Discussed with patient her next steps which include getting blood work today, scheduling an initial OB visit, and getting an ultrasound.  Also prescribed prenatal vitamins and Diclegis for patient to help with her vomiting.

## 2020-06-04 ENCOUNTER — Other Ambulatory Visit: Payer: Self-pay | Admitting: Family Medicine

## 2020-06-06 ENCOUNTER — Other Ambulatory Visit: Payer: Self-pay

## 2020-06-06 ENCOUNTER — Ambulatory Visit
Admission: RE | Admit: 2020-06-06 | Discharge: 2020-06-06 | Disposition: A | Discharge status: Home / Self Care | Payer: Medicaid Other | Source: Ambulatory Visit | Attending: Family Medicine | Admitting: Family Medicine

## 2020-06-06 DIAGNOSIS — N926 Irregular menstruation, unspecified: Secondary | ICD-10-CM | POA: Insufficient documentation

## 2020-06-24 ENCOUNTER — Other Ambulatory Visit (HOSPITAL_COMMUNITY)
Admission: RE | Admit: 2020-06-24 | Discharge: 2020-06-24 | Disposition: A | Discharge status: Home / Self Care | Payer: Medicaid Other | Source: Ambulatory Visit | Attending: Family Medicine | Admitting: Family Medicine

## 2020-06-24 ENCOUNTER — Ambulatory Visit (INDEPENDENT_AMBULATORY_CARE_PROVIDER_SITE_OTHER): Payer: Self-pay | Admitting: Family Medicine

## 2020-06-24 ENCOUNTER — Other Ambulatory Visit: Payer: Self-pay

## 2020-06-24 VITALS — BP 110/60 | HR 112 | Wt 160.4 lb

## 2020-06-24 DIAGNOSIS — Z3400 Encounter for supervision of normal first pregnancy, unspecified trimester: Secondary | ICD-10-CM | POA: Diagnosis not present

## 2020-06-24 DIAGNOSIS — Z3A13 13 weeks gestation of pregnancy: Secondary | ICD-10-CM | POA: Diagnosis not present

## 2020-06-24 NOTE — Progress Notes (Signed)
Patient Name: Linda Nunez Date of Birth: Jun 16, 1995 [facility] Initial Prenatal Visit  Linda Nunez is a 25 y.o. year old G1P0 at [redacted]w[redacted]d  who presents for her initial prenatal visit. Pt had positive pregnancy test on 05/28/20 in office.  Pregnancy is not planned She reports morning sickness and nausea. She is not taking a prenatal vitamin. She has prenatal vitamins but is not taking them but she does not want to vomit them back up.   She denies pelvic pain or vaginal bleeding.   Pregnancy Dating: . The patient is dated by ultrasound. Reviewed from 06/06/20  . LMP: LMP 04/12/20 . Period is certain:  Yes.  . Periods were regular:  Yes.  Marland Kitchen LMP was a typical period:  Yes.  . Using hormonal contraception in 3 months prior to conception: No  Lab Review: . Blood type: O . Rh Status: + . Antibody screen: Negative . HIV: Negative . RPR: Negative . Hemoglobin electrophoresis reviewed: Yes . Results of OB urine culture are: Negative . Rubella: Immune . Varicella status is Immune. Pt has chicken pox when she was young.  PMH: Reviewed and as detailed below: . HTN: No  . Type 1 or 2 Diabetes: No  . Depression:  No  . Seizure disorder:  No . VTE: No ,  . History of STI Yes. Pt reports hx of syphilis and was treated [facility] the [facility] department. Also has hx of chlamydia and was treated.  . Abnormal Pap smear:  No. Reviewed normal PAP from 05/2019.  Marland Kitchen Genital herpes simplex:  No   PSH: . Gynecologic Surgery:  no . Surgical history reviewed, notable for: none. No previous surgeries.   Obstetric History: . Obstetric history tab updated and reviewed.  . Summary of prior pregnancies: no prior pregnancy . Cesarean delivery: No  . Gestational Diabetes:  No . Hypertension in pregnancy: No . History of preterm birth: No . History of LGA/SGA infant:  No . History of shoulder dystocia: No . Indications for referral were reviewed, and the patient has no obstetric indications for  referral to High Risk OB Clinic at this time.   Social History: . Partner's name: Cyndie Mull  . Tobacco use: No.  . Alcohol use:  No. . Other substance use:  Yes. Previously smoked marijuana prior to finding out she was pregnant. Denies use since pregnant.   Current Medications:  . Not taking Metformin for PCOS nor prenatal vitamins.  Advised to take prenatal vitamins. Dr. Clifton Custard to discuss with Dr. Shawnie Pons regarding Metformin.    . Reviewed and appropriate in pregnancy.   Genetic and Infection Screen: . Flow Sheet Updated Yes  Prenatal Exam: Gen: Well nourished, well developed.  No distress.  Vitals noted. HEENT: Normocephalic, atraumatic.  Neck supple without cervical lymphadenopathy, thyromegaly or thyroid nodules.  Fair dentition. CV: RRR no murmur, gallops or rubs Lungs: CTA B.  Normal respiratory effort without wheezes or rales. Abd: soft, NTND. +BS.  Uterus not appreciated above pelvis. GU: Normal external female genitalia without lesions.  Nl vaginal, well rugated without lesions. No vaginal discharge.  Bimanual exam: No adnexal mass or TTP. No CMT.  Uterus size below the umbilicus Ext: No clubbing, cyanosis or edema. Psych: Normal grooming and dress.  Not depressed or anxious appearing.  Normal thought content and process without flight of ideas or looseness of associations  Fetal heart tones: Appropriate  Assessment/Plan:  Linda Nunez is a 25 y.o. G1P0 at [redacted]w[redacted]d who presents  to initiate prenatal care. She is doing well.  Current pregnancy issues include persistent nausea and vomiting.   1. Routine prenatal care: Marland Kitchen As dating is reliable, a dating ultrasound has been ordered. Dating tab updated. . Pre-pregnancy weight updated. Expected weight gain this pregnancy is 15-25 pounds. . Prenatal labs reviewed, notable for none. . Indications for referral to HROB were reviewed and the patient does not meet criteria for referral.  . Medication list reviewed and updated.   . Recommended patient see a dentist for regular care.  . Bleeding and pain precautions reviewed. . Importance of prenatal vitamins reviewed.  . Genetic screening offered. Patient opted for: cell free DNA testing. Paperwork completed by Dr. Clifton Custard.  . The patient will not be age 57 or over at time of delivery. Referral to genetic counseling was offered today.  . The patient has the following risk factors for preexisting diabetes: BMI > 25 and sedentary lifestyle. An early 1 hour glucose tolerance test was ordered. . Pregnancy Medical Home and PHQ-9 forms completed, problems noted: Yes  2. Given risk factors. Consider initiating ASA at 12 weeks.   3. 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   4. Nausea and vomiting. Pt with recent MAU visit for same. Encouraged Diclegis for symptomatic control. Pt not taking prenatal vitamins due to vomiting. Discussed need to continue prenatal vitamins.   5. History of PCOS. Pt was taking Metformin. Early ogtt 135. Dr. Clifton Custard to discuss with Dr. Shawnie Pons regarding Metformin.       Follow up 4 weeks for next prenatal visit.

## 2020-06-25 LAB — CERVICOVAGINAL ANCILLARY ONLY
Chlamydia: NEGATIVE
Comment: NEGATIVE
Comment: NEGATIVE
Comment: NORMAL
Neisseria Gonorrhea: NEGATIVE
Trichomonas: NEGATIVE

## 2020-06-25 LAB — GLUCOSE TOLERANCE, 1 HOUR: Glucose, 1Hr PP: 135 mg/dL (ref 65–199)

## 2020-06-26 NOTE — Progress Notes (Deleted)
   SUBJECTIVE:   CHIEF COMPLAINT / HPI:   Chief Complaint  Patient presents with  . Routine Prenatal Visit     Linda Nunez is a 25 y.o. female here for ***   Pt reports ***    PERTINENT  PMH / PSH: reviewed and updated as appropriate   OBJECTIVE:   BP 110/60   Pulse (!) 112   Wt 160 lb 6 oz (72.7 kg)   LMP 03/23/2020   BMI 27.53 kg/m   ***  ASSESSMENT/PLAN:   No problem-specific Assessment & Plan notes found for this encounter.     Katha Cabal, DO PGY-2, [facility] 06/26/2020      {    This will disappear when note is signed, click to select method of visit    :1}

## 2020-07-01 ENCOUNTER — Other Ambulatory Visit: Payer: Self-pay | Admitting: Family Medicine

## 2020-07-15 ENCOUNTER — Encounter: Payer: Self-pay | Admitting: Family Medicine

## 2020-07-16 NOTE — Telephone Encounter (Signed)
Called patient to gather more information. Patient denies vaginal bleeding, abdominal pain, back pain, painful urination, vaginal irritation or itching. Patient does report using new laundry detergent and notices some irritation after putting clean underwear on. Patient reports that the discharge has been scant in amount and is light brown with no odor.   Advised of MAU precautions. Advised patient to monitor symptoms and that we could schedule her in clinic if she has any changes in symptoms.   Please let me know if you would like me to schedule patient in clinic for follow up or if you have any other recommendations.   Veronda Prude, RN

## 2020-07-31 ENCOUNTER — Other Ambulatory Visit: Payer: Self-pay

## 2020-07-31 ENCOUNTER — Encounter: Payer: Self-pay | Admitting: Family Medicine

## 2020-07-31 ENCOUNTER — Ambulatory Visit (INDEPENDENT_AMBULATORY_CARE_PROVIDER_SITE_OTHER): Payer: Self-pay | Admitting: Family Medicine

## 2020-07-31 VITALS — BP 99/69 | HR 90 | Ht 64.0 in | Wt 163.4 lb

## 2020-07-31 DIAGNOSIS — K5900 Constipation, unspecified: Secondary | ICD-10-CM

## 2020-07-31 DIAGNOSIS — J302 Other seasonal allergic rhinitis: Secondary | ICD-10-CM

## 2020-07-31 DIAGNOSIS — Z3400 Encounter for supervision of normal first pregnancy, unspecified trimester: Secondary | ICD-10-CM

## 2020-07-31 MED ORDER — LORATADINE 10 MG PO TABS: 10.0000 mg | ORAL_TABLET | Freq: Every day | ORAL | 11 refills | Status: DC

## 2020-07-31 MED ORDER — POLYETHYLENE GLYCOL 3350 17 GM/SCOOP PO POWD: 17.0000 g | Freq: Every day | ORAL | 0 refills | Status: DC

## 2020-07-31 NOTE — Progress Notes (Deleted)
   SUBJECTIVE:   CHIEF COMPLAINT / HPI:   Chief Complaint  Patient presents with  . Medication Refill     Linda Nunez is a 25 y.o. female here for ***   Pt reports ***    PERTINENT  PMH / PSH: reviewed and updated as appropriate   OBJECTIVE:   BP 99/69   Pulse 90   Ht 5\' 4"  (1.626 m)   Wt 163 lb 6 oz (74.1 kg)   LMP 03/23/2020   SpO2 99%   BMI 28.04 kg/m   ***  ASSESSMENT/PLAN:   No problem-specific Assessment & Plan notes found for this encounter.     05/21/2020, DO PGY-2, [facility] 07/31/2020      {    This will disappear when note is signed, click to select method of visit    :1}

## 2020-07-31 NOTE — Progress Notes (Signed)
   Patient Name: [name] Date of Birth: 1995-07-28 [facility] Prenatal Visit  Linda Nunez is a 25 y.o. G1P0 at [redacted]w[redacted]d here for routine follow up. She is dated by early ultrasound.  She reports headache, no contractions, no cramping, no leaking and vomiting.  She denies vaginal bleeding.  See flow sheet for details. Endorses constipation and hemorrhoids. Denies abdominal pain.   Vitals:   07/31/20 1451  Pulse: 90  SpO2: 99%     A/P: Pregnancy at [redacted]w[redacted]d.  Doing well.    1. Routine Prenatal Care:  Marland Kitchen Dating reviewed, dating tab is correct . Fetal heart tones Appropriate . Influenza vaccine not administered as pt previous declined and at the end of influenza season.   Marland Kitchen COVID vaccination was discussed and patient declined. All questions asked and answered.  . The patient has the following indication for screening preexisting diabetes: BMI > 25 and sedentary lifestyle. Required 3hr gtt.  . Anatomy ultrasound ordered to be scheduled at 18-20 weeks. . Patient is interested in genetic screening. As she is past 13 weeks and 6 days, a AFP was offered. Low risk Panorama.  . Pregnancy education including expected weight gain in pregnancy, OTC medication use, continued use of prenatal vitamin, smoking cessation if applicable, and nutrition in pregnancy.   . Bleeding and pain precautions reviewed.  2. Pregnancy issues include the following and were addressed as appropriate today:  . Constipation - Start Miralax and increase dietary fiber intake.   . At risk for Pre-E: Start low dose aspirin daily.  . Will need to discuss pp contraception plans at follow up.  . 1hr gtt 135. Pt scheduled for 3 hour gtt on 5/16.22.  Marland Kitchen Genetic screening: Pamorama low risk. AFP ordered.  . Problem list  and pregnancy box updated: Yes.   Follow up 4 weeks.

## 2020-07-31 NOTE — Patient Instructions (Addendum)
It was great seeing you today!   For constipation: Take Miralax 1 capful daily.   For allergies: Take Loratadine 10 mg daily.   Start taking a low dose (81 mg) Aspirin daily.   Be sure to come back on Monday for your diabetes screening test and additional genetic screening. Be here by 9 AM.   Follow up with me in 4 weeks as scheduled.    If you have questions or concerns please do not hesitate to call at [phone].  Dr. Katherina [facility]

## 2020-08-01 ENCOUNTER — Telehealth: Payer: Self-pay

## 2020-08-01 NOTE — Telephone Encounter (Signed)
Spoke with patients mother. Asked her to inform the patient of her Anatomy scan at [facility] on June 10th at 8:00am. Patients mother said that she would let her know. Aquilla Solian, CMA

## 2020-08-02 MED ORDER — ASPIRIN EC 81 MG PO TBEC: 81.0000 mg | DELAYED_RELEASE_TABLET | Freq: Every day | ORAL | 11 refills | Status: DC

## 2020-08-05 ENCOUNTER — Other Ambulatory Visit: Payer: Self-pay | Admitting: Family Medicine

## 2020-08-05 ENCOUNTER — Other Ambulatory Visit: Payer: Self-pay

## 2020-08-05 ENCOUNTER — Other Ambulatory Visit (INDEPENDENT_AMBULATORY_CARE_PROVIDER_SITE_OTHER): Payer: Self-pay

## 2020-08-05 DIAGNOSIS — Z3400 Encounter for supervision of normal first pregnancy, unspecified trimester: Secondary | ICD-10-CM

## 2020-08-05 LAB — POCT CBG (FASTING - GLUCOSE)-MANUAL ENTRY: Glucose Fasting, POC: 82 mg/dL (ref 70–99)

## 2020-08-06 LAB — GESTATIONAL GLUCOSE TOLERANCE
Glucose, Fasting: 77 mg/dL (ref 65–94)
Glucose, GTT - 1 Hour: 98 mg/dL (ref 65–179)
Glucose, GTT - 2 Hour: 89 mg/dL (ref 65–154)
Glucose, GTT - 3 Hour: 85 mg/dL (ref 65–139)

## 2020-08-14 ENCOUNTER — Encounter: Payer: Self-pay | Admitting: Family Medicine

## 2020-08-26 ENCOUNTER — Ambulatory Visit (INDEPENDENT_AMBULATORY_CARE_PROVIDER_SITE_OTHER): Payer: Medicaid Other | Admitting: Family Medicine

## 2020-08-26 ENCOUNTER — Other Ambulatory Visit: Payer: Self-pay

## 2020-08-26 VITALS — BP 112/80 | HR 86 | Wt 173.0 lb

## 2020-08-26 DIAGNOSIS — N926 Irregular menstruation, unspecified: Secondary | ICD-10-CM

## 2020-08-26 DIAGNOSIS — Z3400 Encounter for supervision of normal first pregnancy, unspecified trimester: Secondary | ICD-10-CM

## 2020-08-26 DIAGNOSIS — J302 Other seasonal allergic rhinitis: Secondary | ICD-10-CM

## 2020-08-26 MED ORDER — ASPIRIN EC 81 MG PO TBEC: 81.0000 mg | DELAYED_RELEASE_TABLET | Freq: Every day | ORAL | 11 refills | Status: DC

## 2020-08-26 MED ORDER — LORATADINE 10 MG PO TABS: 10.0000 mg | ORAL_TABLET | Freq: Every day | ORAL | 11 refills | Status: DC

## 2020-08-26 MED ORDER — PRENATAL VITAMIN 27-0.8 MG PO TABS
ORAL_TABLET | ORAL | 10 refills | Status: DC
Start: 2020-08-26 — End: 2020-09-02

## 2020-08-26 NOTE — Patient Instructions (Addendum)
Take 81 mg aspirin daily and continue taking prenatal daily. Follow up for your anatomy ultrasound on 6/10 as scheduled.   Second Trimester of Pregnancy  The second trimester of pregnancy is from week 13 through week 27. This is also called months 4 through 6 of pregnancy. This is often the time when you feel your best. During the second trimester:  Morning sickness is less or has stopped.  You may have more energy.  You may feel hungry more often. At this time, your unborn baby (fetus) is growing very fast. At the end of the sixth month, the unborn baby may be up to 12 inches long and weigh about 1 pounds. You will likely start to feel the baby move between 16 and 20 weeks of pregnancy. Body changes during your second trimester Your body continues to go through many changes during this time. The changes vary and generally return to normal after the baby is born. Physical changes  You will gain more weight.  You may start to get stretch marks on your hips, belly (abdomen), and breasts.  Your breasts will grow and may hurt.  Dark spots or blotches may develop on your face.  A dark line from your belly button to the pubic area (linea nigra) may appear.  You may have changes in your hair. Health changes  You may have headaches.  You may have heartburn.  You may have trouble pooping (constipation).  You may have hemorrhoids or swollen, bulging veins (varicose veins).  Your gums may bleed.  You may pee (urinate) more often.  You may have back pain. Follow these instructions at home: Medicines  Take over-the-counter and prescription medicines only as told by your doctor. Some medicines are not safe during pregnancy.  Take a prenatal vitamin that contains at least 600 micrograms (mcg) of folic acid. Eating and drinking  Eat healthy meals that include: ? Fresh fruits and vegetables. ? Whole grains. ? Good sources of protein, such as meat, eggs, or tofu. ? Low-fat dairy  products.  Avoid raw meat and unpasteurized juice, milk, and cheese.  You may need to take these actions to prevent or treat trouble pooping: ? Drink enough fluids to keep your pee (urine) pale yellow. ? Eat foods that are high in fiber. These include beans, whole grains, and fresh fruits and vegetables. ? Limit foods that are high in fat and sugar. These include fried or sweet foods. Activity  Exercise only as told by your doctor. Most people can do their usual exercise during pregnancy. Try to exercise for 30 minutes at least 5 days a week.  Stop exercising if you have pain or cramps in your belly or lower back.  Do not exercise if it is too hot or too humid, or if you are in a place of great height (high altitude).  Avoid heavy lifting.  If you choose to, you may have sex unless your doctor tells you not to. Relieving pain and discomfort  Wear a good support bra if your breasts are sore.  Take warm water baths (sitz baths) to soothe pain or discomfort caused by hemorrhoids. Use hemorrhoid cream if your doctor approves.  Rest with your legs raised (elevated) if you have leg cramps or low back pain.  If you develop bulging veins in your legs: ? Wear support hose as told by your doctor. ? Raise your feet for 15 minutes, 3-4 times a day. ? Limit salt in your food. Safety  Wear your seat  belt at all times when you are in a car.  Talk with your doctor if someone is hurting you or yelling at you a lot. Lifestyle  Do not use hot tubs, steam rooms, or saunas.  Do not douche. Do not use tampons or scented sanitary pads.  Avoid cat litter boxes and soil used by cats. These carry germs that can harm your baby and can cause a loss of your baby by miscarriage or stillbirth.  Do not use herbal medicines, illegal drugs, or medicines that are not approved by your doctor. Do not drink alcohol.  Do not smoke or use any products that contain nicotine or tobacco. If you need help  quitting, ask your doctor. General instructions  Keep all follow-up visits. This is important.  Ask your doctor about local prenatal classes.  Ask your doctor about the right foods to eat or for help finding a counselor. Where to find more information  American Pregnancy Association: americanpregnancy.org  [employer]necologists: www.acog.org  Office on [employer] [facility]: Might[employer]ard[employer]co.nz Contact a doctor if:  You have a headache that does not go away when you take medicine.  You have changes in how you see, or you see spots in front of your eyes.  You have mild cramps, pressure, or pain in your lower belly.  You continue to feel like you may vomit (nauseous), you vomit, or you have watery poop (diarrhea).  You have bad-smelling fluid coming from your vagina.  You have pain when you pee or your pee smells bad.  You have very bad swelling of your face, hands, ankles, feet, or legs.  You have a fever. Get help right away if:  You are leaking fluid from your vagina.  You have spotting or bleeding from your vagina.  You have very bad belly cramping or pain.  You have trouble breathing.  You have chest pain.  You faint.  You have not felt your baby move for the time period told by your doctor.  You have new or increased pain, swelling, or redness in an arm or leg. Summary  The second trimester of pregnancy is from week 13 through week 27 (months 4 through 6).  Eat healthy meals.  Exercise as told by your doctor. Most people can do their usual exercise during pregnancy.  Do not use herbal medicines, illegal drugs, or medicines that are not approved by your doctor. Do not drink alcohol.  Call your doctor if you get sick or if you notice anything unusual about your pregnancy. This information is not intended to replace advice given to you by your health care provider. Make sure you discuss any questions you have with your health  care provider. Document Revised: 08/16/2019 Document Reviewed: 06/22/2019 Elsevier Patient Education  2021 [employer].

## 2020-08-26 NOTE — Progress Notes (Signed)
   Patient Name: Linda Nunez Date of Birth: December 14, 1995 [facility] Prenatal Visit  Linda Nunez is a 25 y.o. G1P0 at [redacted]w[redacted]d here for routine follow up. She is dated by early ultrasound.  She reports no contractions, no cramping and no leaking. Vomiting has improved significantly. She denies vaginal bleeding.  See flow sheet for details.  Thinking about Nexplanon for post-partum contraception.   Vitals:   08/26/20 1623  BP: 112/80  Pulse: 86     A/P: Pregnancy at [redacted]w[redacted]d.  Doing well.    1. Routine Prenatal Care:  Marland Kitchen Dating reviewed, dating tab is correct . Fetal heart tones Appropriate . Influenza vaccine not administered as not influenza season.   Marland Kitchen COVID vaccination was discussed and declined.   . The patient has the following indication for screening preexisting diabetes: BMI > 25 and sedentary lifestyle, 3hr gtt normal.  . Anatomy ultrasound ordered to be scheduled at 18-20 weeks. . Patient had low risk genetic screening (Panorama/NIPT).  . Pregnancy education including expected weight gain in pregnancy, OTC medication use, continued use of prenatal vitamin, smoking cessation if applicable, and nutrition in pregnancy.   . Bleeding and pain precautions reviewed.  2. Pregnancy issues include the following and were addressed as appropriate today:  . Constipation: Continue Miralax 1 capful.  . Allergies: Take Loratadine 10 mg daily.  . High risk for Pre-E Continue low dose aspirin.  . Patient considering Nexplanon but is unsure. Continue to discuss.  . 3hr-gtt was normal. Pt does not have gDM.  Marland Kitchen Follow up anatomy ultrasound scheduled for 08/30/20.  . Problem list  and pregnancy box updated: Yes.   Follow up 4 weeks.

## 2020-08-28 NOTE — Progress Notes (Deleted)
   SUBJECTIVE:   CHIEF COMPLAINT / HPI:   Chief Complaint  Patient presents with  . ob appt     Linda Nunez is a 25 y.o. female here for ***   Pt reports ***    PERTINENT  PMH / PSH: reviewed and updated as appropriate   OBJECTIVE:   BP 112/80   Pulse 86   Wt 173 lb (78.5 kg)   LMP 03/23/2020   BMI 29.70 kg/m   ***  ASSESSMENT/PLAN:   No problem-specific Assessment & Plan notes found for this encounter.     Katha Cabal, DO PGY-2,  Family Medicine 08/28/2020      {    This will disappear when note is signed, click to select method of visit    :1}

## 2020-08-30 ENCOUNTER — Other Ambulatory Visit: Payer: Self-pay | Admitting: Family Medicine

## 2020-08-30 ENCOUNTER — Other Ambulatory Visit: Payer: Self-pay | Admitting: *Deleted

## 2020-08-30 ENCOUNTER — Other Ambulatory Visit: Payer: Self-pay

## 2020-08-30 ENCOUNTER — Ambulatory Visit: Payer: Medicaid Other | Attending: Family Medicine

## 2020-08-30 DIAGNOSIS — O321XX0 Maternal care for breech presentation, not applicable or unspecified: Secondary | ICD-10-CM

## 2020-08-30 DIAGNOSIS — Z3400 Encounter for supervision of normal first pregnancy, unspecified trimester: Secondary | ICD-10-CM | POA: Diagnosis not present

## 2020-08-30 DIAGNOSIS — Z363 Encounter for antenatal screening for malformations: Secondary | ICD-10-CM

## 2020-08-30 DIAGNOSIS — Z3A20 20 weeks gestation of pregnancy: Secondary | ICD-10-CM

## 2020-08-30 DIAGNOSIS — Z362 Encounter for other antenatal screening follow-up: Secondary | ICD-10-CM

## 2020-09-01 ENCOUNTER — Encounter: Payer: Self-pay | Admitting: Family Medicine

## 2020-09-01 DIAGNOSIS — Z3400 Encounter for supervision of normal first pregnancy, unspecified trimester: Secondary | ICD-10-CM

## 2020-09-02 ENCOUNTER — Other Ambulatory Visit: Payer: Self-pay | Admitting: *Deleted

## 2020-09-02 ENCOUNTER — Other Ambulatory Visit: Payer: Self-pay | Admitting: Family Medicine

## 2020-09-02 DIAGNOSIS — Z362 Encounter for other antenatal screening follow-up: Secondary | ICD-10-CM

## 2020-09-02 DIAGNOSIS — N926 Irregular menstruation, unspecified: Secondary | ICD-10-CM

## 2020-09-02 MED ORDER — PRENATAL VITAMIN 27-0.8 MG PO TABS: ORAL_TABLET | ORAL | 10 refills | Status: DC

## 2020-09-26 ENCOUNTER — Other Ambulatory Visit (HOSPITAL_COMMUNITY)
Admission: RE | Admit: 2020-09-26 | Discharge: 2020-09-26 | Disposition: A | Discharge status: Home / Self Care | Payer: Medicaid Other | Source: Ambulatory Visit | Attending: Family Medicine | Admitting: Family Medicine

## 2020-09-26 ENCOUNTER — Ambulatory Visit (INDEPENDENT_AMBULATORY_CARE_PROVIDER_SITE_OTHER): Payer: Self-pay | Admitting: Family Medicine

## 2020-09-26 ENCOUNTER — Other Ambulatory Visit: Payer: Self-pay

## 2020-09-26 VITALS — BP 117/74 | HR 96 | Wt 180.6 lb

## 2020-09-26 DIAGNOSIS — Z8619 Personal history of other infectious and parasitic diseases: Secondary | ICD-10-CM | POA: Diagnosis not present

## 2020-09-26 DIAGNOSIS — Z3492 Encounter for supervision of normal pregnancy, unspecified, second trimester: Secondary | ICD-10-CM

## 2020-09-26 DIAGNOSIS — J302 Other seasonal allergic rhinitis: Secondary | ICD-10-CM

## 2020-09-26 MED ORDER — FLUTICASONE PROPIONATE 50 MCG/ACT NA SUSP: 2.0000 | Freq: Every day | NASAL | 6 refills | Status: DC

## 2020-09-26 NOTE — Progress Notes (Signed)
[facility] Visit  Ciarra D Rask is a 25 y.o. G1P0 at [redacted]w[redacted]d (via 8wk sono) who presents to [facility] for routine follow up. Prenatal course, history, notes, ultrasounds, and laboratory results reviewed.  Denies cramping/ctx, fluid leaking, vaginal bleeding, or decreased fetal movement. Taking PNV.    Primary Prenatal Care Provider: Dr Rachael Darby  Postpartum Plans: - delivery planning: SVD, not wanting epidural - circumcision: N/a female - feeding: breast - pediatrician: undecided, ? [facility] - contraception: Nexplanon (discussed all options today)  FHR: 145 bpm Uterine size: 24 cm  Assessment & Plan  1. Routine prenatal care: - declined COVID vaccine today  2. History of STI- chlamydia and syphilis many years ago. Offered testing today, elected for Gonorrhea/chlamydia/trich swabs done, Deseree Blount CMA present as chaperone. Will get lab work next visit. Discussed condoms to prevent STI.   3. Seasonal allergies- taking claritin, start flonase today  Next prenatal visit in 4 weeks . Labor & fetal movement precautions discussed.  At Next visit: - Tdap - CBC, RPR, HIV, 3 hr GTT (not 1 hour as she has previously failed the early 1 hr GTT)  Burley Saver, MD [facility]

## 2020-09-26 NOTE — Patient Instructions (Addendum)
It was wonderful to see you today.  Please bring ALL of your medications with you to every visit.   Today we talked about:  - We did swabs for gonorrhea and chlamydia today, I will call you if they are positive - Please schedule a follow up appointment for 4 weeks from now- we will do a lab test at that time and you need to not have anything to eat or drink beforehand as you will do the sugar test and stay for 3 hours that day - I sent flonase to your pharmacy to see if this helps with your allergies, continue taking your claritin   Thank you for choosing [facility].   Please call [phone] with any questions about today's appointment.  Please be sure to schedule follow up at the front  desk before you leave today.   Burley Saver, MD  Family Medicine     Pregnancy Related Return Precautions The follow are signs/symptoms that are abnormal in pregnancy and may require further evaluation by a physician: Go to the MAU at [facility] if: You have cramping/contractions that do not go away with drinking water, especially if they are lasting 30 seconds to 1.5 minutes, coming and going every 5-10 minutes for an hour or more, or are getting stronger and you cannot walk or talk while having a contraction/cramp. Your water breaks.  Sometimes it is a big gush of fluid, sometimes it is just a trickle that keeps getting your underwear wet or running down your legs You have vaginal bleeding.    You do not feel your baby moving like normal.  If you do not, get something to eat and drink (something cold or something with sugar like peanut butter or juice) and lay down and focus on feeling your baby move. If your baby is still not moving like normal, you should go to MAU. You should feel your baby move 6 times in one hour, or 10 times in two hours. You have a persistent headache that does not go away with 1 g of Tylenol, vision changes, chest pain,  difficulty breathing, severe pain in your right upper abdomen, worsening leg swelling- these can all be signs of high blood pressure in pregnancy and need to be evaluated by a provider immediately  These are all concerning in pregnancy and if you have any of these I recommend you call your PCP and present to the Maternity Admissions Unit (map below) for further evaluation.  For any pregnancy-related emergencies, please go to the Maternity Admissions Unit in the [facility] at [facility]. You will use hospital Entrance C.

## 2020-09-27 ENCOUNTER — Ambulatory Visit: Payer: Medicaid Other | Admitting: *Deleted

## 2020-09-27 ENCOUNTER — Encounter: Payer: Self-pay | Admitting: *Deleted

## 2020-09-27 ENCOUNTER — Ambulatory Visit: Payer: Medicaid Other | Attending: Maternal & Fetal Medicine

## 2020-09-27 VITALS — BP 102/61 | HR 94

## 2020-09-27 DIAGNOSIS — Z362 Encounter for other antenatal screening follow-up: Secondary | ICD-10-CM | POA: Diagnosis not present

## 2020-09-27 DIAGNOSIS — Z3A24 24 weeks gestation of pregnancy: Secondary | ICD-10-CM

## 2020-09-27 LAB — CERVICOVAGINAL ANCILLARY ONLY
Chlamydia: NEGATIVE
Comment: NEGATIVE
Comment: NEGATIVE
Comment: NORMAL
Neisseria Gonorrhea: NEGATIVE
Trichomonas: NEGATIVE

## 2020-10-04 ENCOUNTER — Encounter: Payer: Medicaid Other | Admitting: Family Medicine

## 2020-10-14 ENCOUNTER — Other Ambulatory Visit: Payer: Self-pay | Admitting: *Deleted

## 2020-10-14 DIAGNOSIS — Z3400 Encounter for supervision of normal first pregnancy, unspecified trimester: Secondary | ICD-10-CM

## 2020-10-21 ENCOUNTER — Other Ambulatory Visit: Payer: Self-pay

## 2020-10-21 ENCOUNTER — Ambulatory Visit (INDEPENDENT_AMBULATORY_CARE_PROVIDER_SITE_OTHER): Payer: Medicaid Other | Admitting: Family Medicine

## 2020-10-21 ENCOUNTER — Other Ambulatory Visit (INDEPENDENT_AMBULATORY_CARE_PROVIDER_SITE_OTHER): Payer: Medicaid Other

## 2020-10-21 VITALS — BP 106/60 | HR 99 | Wt 181.0 lb

## 2020-10-21 DIAGNOSIS — Z23 Encounter for immunization: Secondary | ICD-10-CM

## 2020-10-21 DIAGNOSIS — Z3403 Encounter for supervision of normal first pregnancy, third trimester: Secondary | ICD-10-CM | POA: Diagnosis not present

## 2020-10-21 DIAGNOSIS — Z3400 Encounter for supervision of normal first pregnancy, unspecified trimester: Secondary | ICD-10-CM

## 2020-10-21 LAB — GLUCOSE, POCT (MANUAL RESULT ENTRY): POC Glucose: 94 mg/dl (ref 70–99)

## 2020-10-21 NOTE — Patient Instructions (Signed)
It was great seeing you today!  Please check-out at the front desk before leaving the clinic. I'd like to see you back in 11/06/20 at 11a but if you need to be seen earlier than that for any new issues we're happy to fit you in, just give [place] a call!  Visit Remembers: - Continue taking your medications as discussed  - Continue to work on your healthy eating habits and incorporating exercise into your daily life.  - Your goal is to have an BP < 120/80 - Medicine Changes: None   Call us if: any pregnancy related questions     Regarding lab work today:  Due to recent changes in healthcare laws, you may see the results of your imaging and laboratory studies on MyChart before your provider has had a chance to review them.  I understand that in some cases there may be results that are confusing or concerning to you. Not all laboratory results come back in the same time frame and you may be waiting for multiple results in order to interpret others.  Please give [place] 72 hours in order for your provider to thoroughly review all the results before contacting the office for clarification of your results. If everything is normal, you will get a letter in the mail or a message in My Chart. Please give [place] a call if you do not hear from [place] after 2 weeks.  Please bring all of your medications with you to each visit.    If you haven't already, sign up for My Chart to have easy access to your labs results, and communication with your primary care physician.  Feel free to call with any questions or concerns at any time, at [phone].   Take care,  Dr. Katherina Right [facility]

## 2020-10-21 NOTE — Progress Notes (Signed)
     [facility] Prenatal Visit  Linda Nunez is a 25 y.o. G1P0 at [redacted]w[redacted]d here for routine follow up. She is dated by early ultrasound.  She reports no complaints. She reports fetal movement. Having regularly soft bowel movements. Denies vaginal bleeding, loss of fluid, or contractions.  See flow sheet for details.  A/P: Pregnancy at [redacted]w[redacted]d.  Doing well.   Dating reviewed, dating tab is correct Fetal heart tones Appropriate Fundal height within expected range.  Influenza vaccine not administered as not influenza season.   COVID vaccination was discussed and declined again today.  Early GDM screening before 24 weeks was normal. Repeated this morning and screening for gestational diabetes completed today and is pending Pregnancy education completed including: fetal growth, breastfeeding, contraception, and expected weight gain in pregnancy.   The patient does not have a history of Cesarean delivery and no referral to Center for [facility] is indicated Seen in [facility] during second trimester on 09/26/20. Preterm labor, bleeding, and pain precautions given.    2. Pregnancy issues include the following and were addressed as appropriate today:  Continue aspirin for Pre-eclampsia ppx Constipation: continue Miralax 1 capful daily PRN.  Seasonal Allergies: Continue Loratadine and Flonase History of STI: GC/CT at last visit (09/26/20) negative.  HIV and RPR today.  Screen for anemia: CBC today  COVID vaccination discussed and declined. Continue to offer at follow up visit.  Follow up on 3 hour gtt.  Problem list and pregnancy box updated: Yes.   Follow up 2 weeks.  Katha Cabal, DO PGY-3, [facility] 10/21/2020

## 2020-10-21 NOTE — Progress Notes (Deleted)
   SUBJECTIVE:   CHIEF COMPLAINT / HPI:   Chief Complaint  Patient presents with  . Routine Prenatal Visit     Linda Nunez is a 25 y.o. female here for ***   Pt reports ***    PERTINENT  PMH / PSH: reviewed and updated as appropriate   OBJECTIVE:   BP 106/60   Pulse 99   Wt 181 lb (82.1 kg)   LMP 03/23/2020   BMI 31.07 kg/m   ***  ASSESSMENT/PLAN:   No problem-specific Assessment & Plan notes found for this encounter.     Katha Cabal, DO PGY-3, [facility] 10/21/2020      {    This will disappear when note is signed, click to select method of visit    :1}

## 2020-10-22 LAB — CBC
Hematocrit: 34.6 % (ref 34.0–46.6)
Hemoglobin: 11.8 g/dL (ref 11.1–15.9)
MCH: 32.4 pg (ref 26.6–33.0)
MCHC: 34.1 g/dL (ref 31.5–35.7)
MCV: 95 fL (ref 79–97)
Platelets: 283 10*3/uL (ref 150–450)
RBC: 3.64 x10E6/uL — ABNORMAL LOW (ref 3.77–5.28)
RDW: 12.7 % (ref 11.7–15.4)
WBC: 6.3 10*3/uL (ref 3.4–10.8)

## 2020-10-22 LAB — RPR: RPR Ser Ql: NONREACTIVE

## 2020-10-22 LAB — GESTATIONAL GLUCOSE TOLERANCE
Glucose, Fasting: 82 mg/dL (ref 65–94)
Glucose, GTT - 1 Hour: 125 mg/dL (ref 65–179)
Glucose, GTT - 2 Hour: 100 mg/dL (ref 65–154)
Glucose, GTT - 3 Hour: 102 mg/dL (ref 65–139)

## 2020-10-22 LAB — HIV ANTIBODY (ROUTINE TESTING W REFLEX): HIV Screen 4th Generation wRfx: NONREACTIVE

## 2020-11-04 NOTE — Progress Notes (Signed)
  [facility] Prenatal Visit  Linda Nunez is a 25 y.o. G1P0 at [redacted]w[redacted]d here for routine follow up. She is dated by early ultrasound.  She reports no complaints. She reports fetal movement. She denies vaginal bleeding, contractions, or loss of fluid. See flow sheet for details.  Vitals:   11/06/20 1157  BP: 108/70  Pulse: 100    A/P: Pregnancy at [redacted]w[redacted]d.  Doing well.   Routine prenatal care:  Dating reviewed, dating tab is correct Fetal heart tones Appropriate 140 bmp Fundal height within expected range.  29 cm Infant feeding choice: Breastfeeding Contraception choice: Nexplanon  Infant circumcision desired not applicable  The patient does not have a history of Cesarean delivery and no referral to Center for [facility] is indicated Influenza vaccine not administered as not influenza season.   Tdap was not given today. 3 hour glucola, CBC, RPR, and HIV were obtained 8/1 and were normal.    Rh status was reviewed and patient does not need Rhogam.  Rhogam was not given today.  Pregnancy medical home and PHQ-9 forms were not done today.   Childbirth and education classes were offered. Preterm labor and fetal movement precautions reviewed.  2. Pregnancy issues include the following and were addressed as appropriate today:  Obesity (BMI 30.0-34.9) - ASA 81 mg daily   Problem list and pregnancy box updated: No, reviewed.   Patient scheduled in [facility] during third trimester on 8/25.  Follow up 2 weeks.

## 2020-11-06 ENCOUNTER — Ambulatory Visit (INDEPENDENT_AMBULATORY_CARE_PROVIDER_SITE_OTHER): Payer: Medicaid Other | Admitting: Family Medicine

## 2020-11-06 ENCOUNTER — Other Ambulatory Visit: Payer: Self-pay

## 2020-11-06 VITALS — BP 108/70 | HR 100 | Wt 185.2 lb

## 2020-11-06 DIAGNOSIS — E669 Obesity, unspecified: Secondary | ICD-10-CM

## 2020-11-06 DIAGNOSIS — Z3403 Encounter for supervision of normal first pregnancy, third trimester: Secondary | ICD-10-CM

## 2020-11-06 NOTE — Patient Instructions (Signed)
Third Trimester of Pregnancy  The third trimester of pregnancy is from week 28 through week 40. This is also called months 7 through 9. This trimester is when your unborn baby (fetus) is growing very fast. At the end of the ninth month, the unborn baby is about20 inches long. It weighs about 6-10 pounds. Body changes during your third trimester Your body continues to go through many changes during this time. The changesvary and generally return to normal after the baby is born. Physical changes Your weight will continue to increase. You may gain 25-35 pounds (11-16 kg) by the end of the pregnancy. If you are underweight, you may gain 28-40 lb (about 13-18 kg). If you are overweight, you may gain 15-25 lb (about 7-11 kg). You may start to get stretch marks on your hips, belly (abdomen), and breasts. Your breasts will continue to grow and may hurt. A yellow fluid (colostrum) may leak from your breasts. This is the first milk you are making for your baby. You may have changes in your hair. Your belly button may stick out. You may have more swelling in your hands, face, or ankles. Health changes You may have heartburn. You may have trouble pooping (constipation). You may get hemorrhoids. These are swollen veins in the butt that can itch or get painful. You may have swollen veins (varicose veins) in your legs. You may have more body aches in the pelvis, back, or thighs. You may have more tingling or numbness in your hands, arms, and legs. The skin on your belly may also feel numb. You may feel short of breath as your womb (uterus) gets bigger. Other changes You may pee (urinate) more often. You may have more problems sleeping. You may notice the unborn baby "dropping," or moving lower in your belly. You may have more discharge coming from your vagina. Your joints may feel loose, and you may have pain around your pelvic bone. Follow these instructions at home: Medicines Take over-the-counter  and prescription medicines only as told by your doctor. Some medicines are not safe during pregnancy. Take a prenatal vitamin that contains at least 600 micrograms (mcg) of folic acid. Eating and drinking Eat healthy meals that include: Fresh fruits and vegetables. Whole grains. Good sources of protein, such as meat, eggs, or tofu. Low-fat dairy products. Avoid raw meat and unpasteurized juice, milk, and cheese. These carry germs that can harm you and your baby. Eat 4 or 5 small meals rather than 3 large meals a day. You may need to take these actions to prevent or treat trouble pooping: Drink enough fluids to keep your pee (urine) pale yellow. Eat foods that are high in fiber. These include beans, whole grains, and fresh fruits and vegetables. Limit foods that are high in fat and sugar. These include fried or sweet foods. Activity Exercise only as told by your doctor. Stop exercising if you start to have cramps in your womb. Avoid heavy lifting. Do not exercise if it is too hot or too humid, or if you are in a place of great height (high altitude). If you choose to, you may have sex unless your doctor tells you not to. Relieving pain and discomfort Take breaks often, and rest with your legs raised (elevated) if you have leg cramps or low back pain. Take warm water baths (sitz baths) to soothe pain or discomfort caused by hemorrhoids. Use hemorrhoid cream if your doctor approves. Wear a good support bra if your breasts are tender. If   you develop bulging, swollen veins in your legs: Wear support hose as told by your doctor. Raise your feet for 15 minutes, 3-4 times a day. Limit salt in your food. Safety Talk to your doctor before traveling far distances. Do not use hot tubs, steam rooms, or saunas. Wear your seat belt at all times when you are in a car. Talk with your doctor if someone is hurting you or yelling at you a lot. Preparing for your baby's arrival To prepare for the arrival  of your baby: Take prenatal classes. Visit the hospital and tour the maternity area. Buy a rear-facing car seat. Learn how to install it in your car. Prepare the baby's room. Take out all pillows and stuffed animals from the baby's crib. General instructions Avoid cat litter boxes and soil used by cats. These carry germs that can cause harm to the baby and can cause a loss of your baby by miscarriage or stillbirth. Do not douche or use tampons. Do not use scented sanitary pads. Do not smoke or use any products that contain nicotine or tobacco. If you need help quitting, ask your doctor. Do not drink alcohol. Do not use herbal medicines, illegal drugs, or medicines that were not approved by your doctor. Chemicals in these products can affect your baby. Keep all follow-up visits. This is important. Where to find more information American Pregnancy Association: americanpregnancy.org American College of Obstetricians and Gynecologists: www.acog.org Office on Women's Health: womenshealth.gov/pregnancy Contact a doctor if: You have a fever. You have mild cramps or pressure in your lower belly. You have a nagging pain in your belly area. You vomit, or you have watery poop (diarrhea). You have bad-smelling fluid coming from your vagina. You have pain when you pee, or your pee smells bad. You have a headache that does not go away when you take medicine. You have changes in how you see, or you see spots in front of your eyes. Get help right away if: Your water breaks. You have regular contractions that are less than 5 minutes apart. You are spotting or bleeding from your vagina. You have very bad belly cramps or pain. You have trouble breathing. You have chest pain. You faint. You have not felt the baby move for the amount of time told by your doctor. You have new or increased pain, swelling, or redness in an arm or leg. Summary The third trimester is from week 28 through week 40 (months 7  through 9). This is the time when your unborn baby is growing very fast. During this time, your discomfort may increase as you gain weight and as your baby grows. Get ready for your baby to arrive by taking prenatal classes, buying a rear-facing car seat, and preparing the baby's room. Get help right away if you are bleeding from your vagina, you have chest pain and trouble breathing, or you have not felt the baby move for the amount of time told by your doctor. This information is not intended to replace advice given to you by your health care provider. Make sure you discuss any questions you have with your healthcare provider. Document Revised: 08/16/2019 Document Reviewed: 06/22/2019 Elsevier Patient Education  2022 Elsevier Inc.  

## 2020-11-14 ENCOUNTER — Ambulatory Visit (INDEPENDENT_AMBULATORY_CARE_PROVIDER_SITE_OTHER): Payer: Medicaid Other | Admitting: Family Medicine

## 2020-11-14 ENCOUNTER — Other Ambulatory Visit: Payer: Self-pay

## 2020-11-14 VITALS — BP 98/60 | HR 117 | Wt 187.2 lb

## 2020-11-14 DIAGNOSIS — Z3403 Encounter for supervision of normal first pregnancy, third trimester: Secondary | ICD-10-CM

## 2020-11-14 NOTE — Progress Notes (Signed)
[facility] Visit  Linda Nunez is a 25 y.o. G1P0 at [redacted]w[redacted]d (via [redacted]w[redacted]d sono) who presents to [facility] for routine follow up. Prenatal course, history, notes, ultrasounds, and laboratory results reviewed.  Denies cramping/ctx, fluid leaking, vaginal bleeding, or decreased fetal movement. Taking PNV, ASA, and Miralax PRN.    Primary Prenatal Care Provider: Dr Rachael Darby  Postpartum Plans: - delivery planning: SVD, does not want epidural - circumcision: female, N/A - feeding: breast  - pediatrician: [facility] - contraception: Nexplanon  FHR: 144 bpm Uterine size: 32 cm  Assessment & Plan  1. Routine prenatal care: - discussed COVID vaccine, declined today - continue ASA and PNV   2. 2. History of STI- chlamydia and syphilis many years ago, recent testing negative, will repeat swabs at 36 WGA.  Next prenatal visit in 2 weeks . Labor & fetal movement precautions discussed.  Burley Saver, MD [facility]

## 2020-11-14 NOTE — Patient Instructions (Signed)
Pregnancy Related Return Precautions The follow are signs/symptoms that are abnormal in pregnancy and may require further evaluation by a physician: Go to the MAU at [facility] [place] if: You have cramping/contractions that do not go away with drinking water, especially if they are lasting 30 seconds to 1.5 minutes, coming and going every 5-10 minutes for an hour or more, or are getting stronger and you cannot walk or talk while having a contraction/cramp. Your water breaks.  Sometimes it is a big gush of fluid, sometimes it is just a trickle that keeps getting your underwear wet or running down your legs You have vaginal bleeding.    You do not feel your baby moving like normal.  If you do not, get something to eat and drink (something cold or something with sugar like peanut butter or juice) and lay down and focus on feeling your baby move. If your baby is still not moving like normal, you should go to MAU. You should feel your baby move 6 times in one hour, or 10 times in two hours. You have a persistent headache that does not go away with 1 g of Tylenol, vision changes, chest pain, difficulty breathing, severe pain in your right upper abdomen, worsening leg swelling- these can all be signs of high blood pressure in pregnancy and need to be evaluated by a provider immediately  These are all concerning in pregnancy and if you have any of these I recommend you call your PCP and present to the Maternity Admissions Unit (map below) for further evaluation.  For any pregnancy-related emergencies, please go to the Maternity Admissions Unit in the [facility]. You will use hospital Entrance C.    

## 2020-12-11 ENCOUNTER — Other Ambulatory Visit: Payer: Self-pay

## 2020-12-11 ENCOUNTER — Ambulatory Visit (INDEPENDENT_AMBULATORY_CARE_PROVIDER_SITE_OTHER): Payer: Medicaid Other | Admitting: Family Medicine

## 2020-12-11 ENCOUNTER — Encounter: Payer: Self-pay | Admitting: Family Medicine

## 2020-12-11 VITALS — BP 112/60 | HR 95 | Ht 64.0 in | Wt 188.8 lb

## 2020-12-11 DIAGNOSIS — Z23 Encounter for immunization: Secondary | ICD-10-CM | POA: Diagnosis not present

## 2020-12-11 DIAGNOSIS — Z3403 Encounter for supervision of normal first pregnancy, third trimester: Secondary | ICD-10-CM

## 2020-12-11 NOTE — Patient Instructions (Signed)
Be sure to stop by the pharmacy to pick up some 81 mg aspirin.  You can take Tylenol for any arm discomfort after your flu shot. Follow up with Dr. Nobie Putnam on 12/23/2020.   Take Care,   Dr Rachael Darby

## 2020-12-11 NOTE — Progress Notes (Deleted)
   SUBJECTIVE:   CHIEF COMPLAINT / HPI:   No chief complaint on file.    Linda Nunez is a 25 y.o. female here for ***   Pt reports ***    PERTINENT  PMH / PSH: reviewed and updated as appropriate   OBJECTIVE:   LMP 03/23/2020   ***  ASSESSMENT/PLAN:   No problem-specific Assessment & Plan notes found for this encounter.     Katha Cabal, DO PGY-3, [facility] 12/11/2020      {    This will disappear when note is signed, click to select method of visit    :1}

## 2020-12-11 NOTE — Progress Notes (Signed)
  [facility] Prenatal Visit  Linda Nunez is a 25 y.o. G1P0 at [redacted]w[redacted]d for routine follow up.  She reports no nausea or vomiting, no fatigue. She reports fetal movement. She denies vaginal bleeding, contractions, or loss of fluid.   See flow sheet for details.  Vitals:   12/11/20 1014  BP: 112/60  Pulse: 95  SpO2: 98%   2 hr  A/P: Pregnancy at [redacted]w[redacted]d.  Doing well.   Routine prenatal care:  Infant feeding choice breast feed Contraception choice Nexplanon  Infant circumcision desired not applicable Tdap was not given today. Given 10/21/20.  Influenza vaccine was given today.  COVID vaccination was discussed and was discussed however pt declined .  GBS/GC/CZ testing was not performed today. Preterm labor precautions reviewed. Safe sleep discussed. Kick counts reviewed.  Preterm labor precautions reviewed. Safe sleep discussed. Kick counts reviewed. Continue prenatal vitamins  Screening for gestational diabetes: 3 hr gtt was normal: fasting 77, 1hr 98, 2hr 89 and 3hr 85  2. Pregnancy issues include the following and were addressed as appropriate today: Pre-eclampsia ppx : Continue low dose ASA. Advised to buy more baby aspirin as she ran out this  Problem list and pregnancy box updated: Yes.   Follow up in 1-2 weeks with Dr. Nobie Putnam as scheduled.    Katha Cabal, DO PGY-3, [facility] 12/11/2020

## 2020-12-15 ENCOUNTER — Encounter: Payer: Self-pay | Admitting: Family Medicine

## 2020-12-15 NOTE — Progress Notes (Deleted)
   SUBJECTIVE:   CHIEF COMPLAINT / HPI:   Chief Complaint  Patient presents with  . Acne  . Follow-up     Linda Nunez is a 25 y.o. female here for ***   Pt reports ***    PERTINENT  PMH / PSH: reviewed and updated as appropriate   OBJECTIVE:   BP 112/60   Pulse 95   Ht 5\' 4"  (1.626 m)   Wt 188 lb 12.8 oz (85.6 kg)   LMP 03/23/2020   SpO2 98%   BMI 32.41 kg/m   ***  ASSESSMENT/PLAN:   No problem-specific Assessment & Plan notes found for this encounter.     05/21/2020, DO PGY-3, [facility] 12/15/2020      {    This will disappear when note is signed, click to select method of visit    :1}

## 2020-12-23 ENCOUNTER — Other Ambulatory Visit: Payer: Self-pay

## 2020-12-23 ENCOUNTER — Ambulatory Visit (INDEPENDENT_AMBULATORY_CARE_PROVIDER_SITE_OTHER): Payer: Medicaid Other | Admitting: Family Medicine

## 2020-12-23 ENCOUNTER — Other Ambulatory Visit (HOSPITAL_COMMUNITY)
Admission: RE | Admit: 2020-12-23 | Discharge: 2020-12-23 | Disposition: A | Discharge status: Home / Self Care | Payer: Medicaid Other | Source: Ambulatory Visit | Attending: Family Medicine | Admitting: Family Medicine

## 2020-12-23 VITALS — BP 103/71 | HR 115 | Wt 194.1 lb

## 2020-12-23 DIAGNOSIS — Z3403 Encounter for supervision of normal first pregnancy, third trimester: Secondary | ICD-10-CM

## 2020-12-23 NOTE — Progress Notes (Signed)
  [facility] Prenatal Visit  Linda Nunez is a 25 y.o. G1P0 at [redacted]w[redacted]d here for routine follow up. She is dated by early ultrasound.  She reports no contractions, no cramping, and no leaking. She reports fetal movement. She denies vaginal bleeding, contractions, or loss of fluid. See flow sheet for details.  There were no vitals filed for this visit.  A/P: Pregnancy at [redacted]w[redacted]d.  Doing well.   Routine prenatal care  Dating reviewed, dating tab is correct Fetal heart tones Appropriate 141 Fundal height within expected range. v 37 Fetal position confirmed Vertex using Ultrasound .  GBS collected today. .  Repeat GC/CT collected today.  The patient does not have a history of HSV and valacyclovir is not indicated at this time.  Infant feeding choice: Breastfeeding Contraception choice: Nexplanon  Infant circumcision desired not applicable Influenza vaccine previously administered.   Tdap previously administered between 27-36 weeks  COVID vaccination was discussed and patient has declined.  Pregnancy education regarding preterm labor, fetal movement,  benefits of breastfeeding, contraception, fetal growth, expected weight gain, and safe infant sleep were discussed.    2. Pregnancy issues include the following and were addressed as appropriate today:   Preeclampsia prophylaxis: Patient has been taking aspirin 81 mg daily.  Recommend continue aspirin use. Repeat gonorrhea and chlamydia collected today as well as GBS. Fetus is head down on ultrasound today  Problem list and pregnancy box updated: Yes.  Follow up 1 week with Dr. Salvadore Dom and access to care.

## 2020-12-23 NOTE — Patient Instructions (Signed)
It was wonderful seeing you today.  I am glad everything is going well.  We collected some samples today and the provider who sees you next week we will go over those results.  If you have any vaginal bleeding, contractions, gush of fluid, or feel like baby is not moving well please seek medical attention at the maternal assessment unit.  I hope you have a wonderful afternoon!

## 2020-12-24 LAB — CERVICOVAGINAL ANCILLARY ONLY
Chlamydia: NEGATIVE
Comment: NEGATIVE
Comment: NORMAL
Neisseria Gonorrhea: NEGATIVE

## 2020-12-26 LAB — CULTURE, BETA STREP (GROUP B ONLY): Strep Gp B Culture: POSITIVE — AB

## 2020-12-30 ENCOUNTER — Ambulatory Visit (INDEPENDENT_AMBULATORY_CARE_PROVIDER_SITE_OTHER): Payer: Medicaid Other | Admitting: Family Medicine

## 2020-12-30 ENCOUNTER — Other Ambulatory Visit: Payer: Self-pay

## 2020-12-30 VITALS — BP 122/78 | HR 100 | Wt 196.2 lb

## 2020-12-30 DIAGNOSIS — Z3403 Encounter for supervision of normal first pregnancy, third trimester: Secondary | ICD-10-CM

## 2020-12-30 DIAGNOSIS — E669 Obesity, unspecified: Secondary | ICD-10-CM

## 2020-12-30 NOTE — Progress Notes (Signed)
  [facility] Prenatal Visit  Linda Nunez is a 25 y.o. G1P0 at [redacted]w[redacted]d here for routine follow up. She is dated by early ultrasound.  She reports no complaints. She reports fetal movement. She denies vaginal bleeding, contractions, or loss of fluid. See flow sheet for details.  Vitals:   12/30/20 0852  BP: 122/78  Pulse: 100    A/P: Pregnancy at [redacted]w[redacted]d.  Doing well.   Routine prenatal care  Dating reviewed, dating tab is correct Fetal heart tones Appropriate Fundal height within expected range.  Fetal position confirmed Vertex using Ultrasound  at previous visit 12/23/20.  GBS not collected today due to being previously collected; she is GBS+. .  Repeat GC/CT not collected today due to being previously collected; they are negative.  The patient does not have a history of HSV and valacyclovir is not indicated at this time.  Infant feeding choice: Breastfeeding Contraception choice: Nexplanon  She desires a natural birth Infant circumcision desired not applicable, female Influenza vaccine previously administered.   Tdap previously administered between 27-36 weeks  COVID vaccination was discussed and patient declines at this time.  Pregnancy education regarding fetal movement, contraception were discussed.    2. Pregnancy issues include the following and were addressed as appropriate today:   Resource for birthing classes given Obesity in pregnancy, BMI 33 - continue ASA 81 mg  Problem list and pregnancy box updated: No; reviewed by this provider.   Follow up 1 week.

## 2020-12-30 NOTE — Patient Instructions (Signed)
Thank you for coming in today.  It was nice to meet you.  I am happy you are doing well. Please go to: [email or web address] This is the website for pregnancy classes. Continue to make sure that baby moves.  Go to the maternity assessment unit if you feel as if baby has decreased movement, you have vaginal bleeding, you have leakage of fluid. Follow-up in 1 week or sooner if needed.

## 2021-01-07 ENCOUNTER — Other Ambulatory Visit: Payer: Self-pay

## 2021-01-07 ENCOUNTER — Ambulatory Visit (INDEPENDENT_AMBULATORY_CARE_PROVIDER_SITE_OTHER): Payer: Medicaid Other | Admitting: Family Medicine

## 2021-01-07 VITALS — BP 104/72 | HR 94 | Wt 196.4 lb

## 2021-01-07 DIAGNOSIS — Z3403 Encounter for supervision of normal first pregnancy, third trimester: Secondary | ICD-10-CM | POA: Diagnosis not present

## 2021-01-07 NOTE — Patient Instructions (Signed)
It was great seeing you today.  It is almost time to have a birthday party!  You are doing well and I have no concerns at this time.  We have scheduled your labor induction for 11/2.  Someone will call you on that date free to come in.  I do want to see you 1 more time in our clinic and then you will need a ultrasound test between 40 and 41 weeks of your pregnancy.  Between now and then if you have any vaginal bleeding, gush of fluid, feel decreased fetal movement or have regular contractions please go to the maternal assessment unit for evaluation.  I hope you have a wonderful day!

## 2021-01-07 NOTE — Progress Notes (Signed)
  [facility] Prenatal Visit  Linda Nunez is a 25 y.o. G1P0 at [redacted]w[redacted]d here for routine follow up. She is dated by early ultrasound.  She reports no bleeding, no contractions, no cramping, and no leaking. She reports fetal movement. She denies vaginal bleeding, contractions, or loss of fluid. See flow sheet for details.  There were no vitals filed for this visit.  A/P: Pregnancy at [redacted]w[redacted]d.  Doing well.   Routine prenatal care:  Dating reviewed, dating tab is correct Fetal heart tones Appropriate 142 Fundal height within expected range.  38 cm Fetal position confirmed Vertex using Ultrasound .  Infant feeding choice: Breastfeeding Contraception choice: Nexplanon  Infant circumcision desired not applicable Pain control in labor discussed and patient desires natural birth but is open to pain management.  Influenza vaccine previously administered.   Tdap previously administered between 27-36 weeks  GBS and gc/chlamydia testing results were reviewed today.   Pregnancy education regarding labor, fetal movement,  benefits of breastfeeding, contraception, and safe infant sleep were discussed.  Labor and fetal movement precautions reviewed. Induction of labor discussed. Scheduled for induction at approximately 41 weeks. BPP scheduled between 40-41 weeks.   2. Pregnancy issues include the following and were addressed as appropriate today:   Patient scheduled for induction of labor on 11/2 Patient scheduled for BPP with maternal-fetal medicine on 10/26 Patient recommended to take 81 mg aspirin daily for preeclampsia prophylaxis.  Continue aspirin use GBS positive-will need penicillin during labor Fetus in cephalic presentation today on ultrasound  Problem list and pregnancy box updated: Yes.   Follow up 1 week.

## 2021-01-09 ENCOUNTER — Encounter (HOSPITAL_COMMUNITY): Payer: Self-pay | Admitting: Obstetrics and Gynecology

## 2021-01-09 ENCOUNTER — Inpatient Hospital Stay (HOSPITAL_COMMUNITY)
Admission: AD | Admit: 2021-01-09 | Discharge: 2021-01-12 | DRG: 807 | Disposition: A | Discharge status: Home / Self Care | Payer: Medicaid Other | Attending: Obstetrics and Gynecology | Admitting: Obstetrics and Gynecology

## 2021-01-09 ENCOUNTER — Other Ambulatory Visit: Payer: Self-pay

## 2021-01-09 DIAGNOSIS — Z87891 Personal history of nicotine dependence: Secondary | ICD-10-CM

## 2021-01-09 DIAGNOSIS — Z7982 Long term (current) use of aspirin: Secondary | ICD-10-CM

## 2021-01-09 DIAGNOSIS — E282 Polycystic ovarian syndrome: Secondary | ICD-10-CM | POA: Diagnosis present

## 2021-01-09 DIAGNOSIS — O99214 Obesity complicating childbirth: Secondary | ICD-10-CM | POA: Diagnosis present

## 2021-01-09 DIAGNOSIS — Z3400 Encounter for supervision of normal first pregnancy, unspecified trimester: Secondary | ICD-10-CM

## 2021-01-09 DIAGNOSIS — O4290 Premature rupture of membranes, unspecified as to length of time between rupture and onset of labor, unspecified weeks of gestation: Secondary | ICD-10-CM | POA: Diagnosis present

## 2021-01-09 DIAGNOSIS — O4292 Full-term premature rupture of membranes, unspecified as to length of time between rupture and onset of labor: Principal | ICD-10-CM | POA: Diagnosis present

## 2021-01-09 DIAGNOSIS — Z3A39 39 weeks gestation of pregnancy: Secondary | ICD-10-CM

## 2021-01-09 DIAGNOSIS — Z30017 Encounter for initial prescription of implantable subdermal contraceptive: Secondary | ICD-10-CM

## 2021-01-09 DIAGNOSIS — O99824 Streptococcus B carrier state complicating childbirth: Secondary | ICD-10-CM | POA: Diagnosis present

## 2021-01-09 DIAGNOSIS — Z20822 Contact with and (suspected) exposure to covid-19: Secondary | ICD-10-CM | POA: Diagnosis present

## 2021-01-09 NOTE — MAU Note (Signed)
PT SAYS SHE WAS SITTING ON SOFA- AT 2320- STOOD - WATER CAME OUT  PNC - WITH [facility]-  NO VE DENIES HSV GBS- POSITIVE  FEELS SOME MILD UC'S

## 2021-01-10 ENCOUNTER — Encounter (HOSPITAL_COMMUNITY): Payer: Self-pay | Admitting: Obstetrics and Gynecology

## 2021-01-10 DIAGNOSIS — O4202 Full-term premature rupture of membranes, onset of labor within 24 hours of rupture: Secondary | ICD-10-CM

## 2021-01-10 DIAGNOSIS — O99824 Streptococcus B carrier state complicating childbirth: Secondary | ICD-10-CM | POA: Diagnosis not present

## 2021-01-10 DIAGNOSIS — Z3A39 39 weeks gestation of pregnancy: Secondary | ICD-10-CM

## 2021-01-10 DIAGNOSIS — Z20822 Contact with and (suspected) exposure to covid-19: Secondary | ICD-10-CM | POA: Diagnosis not present

## 2021-01-10 DIAGNOSIS — O4290 Premature rupture of membranes, unspecified as to length of time between rupture and onset of labor, unspecified weeks of gestation: Secondary | ICD-10-CM | POA: Diagnosis present

## 2021-01-10 DIAGNOSIS — O99214 Obesity complicating childbirth: Secondary | ICD-10-CM | POA: Diagnosis not present

## 2021-01-10 DIAGNOSIS — O4292 Full-term premature rupture of membranes, unspecified as to length of time between rupture and onset of labor: Secondary | ICD-10-CM | POA: Diagnosis not present

## 2021-01-10 DIAGNOSIS — Z7982 Long term (current) use of aspirin: Secondary | ICD-10-CM | POA: Diagnosis not present

## 2021-01-10 DIAGNOSIS — Z30017 Encounter for initial prescription of implantable subdermal contraceptive: Secondary | ICD-10-CM | POA: Diagnosis not present

## 2021-01-10 DIAGNOSIS — Z87891 Personal history of nicotine dependence: Secondary | ICD-10-CM | POA: Diagnosis not present

## 2021-01-10 LAB — CBC
HCT: 34.3 % — ABNORMAL LOW (ref 36.0–46.0)
Hemoglobin: 12 g/dL (ref 12.0–15.0)
MCH: 32.3 pg (ref 26.0–34.0)
MCHC: 35 g/dL (ref 30.0–36.0)
MCV: 92.5 fL (ref 80.0–100.0)
Platelets: 241 10*3/uL (ref 150–400)
RBC: 3.71 MIL/uL — ABNORMAL LOW (ref 3.87–5.11)
RDW: 12.1 % (ref 11.5–15.5)
WBC: 6.6 10*3/uL (ref 4.0–10.5)
nRBC: 0 % (ref 0.0–0.2)

## 2021-01-10 LAB — RPR: RPR Ser Ql: NONREACTIVE

## 2021-01-10 LAB — RESP PANEL BY RT-PCR (FLU A&B, COVID) ARPGX2
Influenza A by PCR: NEGATIVE
Influenza B by PCR: NEGATIVE
SARS Coronavirus 2 by RT PCR: NEGATIVE

## 2021-01-10 LAB — COMPREHENSIVE METABOLIC PANEL
ALT: 14 U/L (ref 0–44)
AST: 21 U/L (ref 15–41)
Albumin: 3.2 g/dL — ABNORMAL LOW (ref 3.5–5.0)
Alkaline Phosphatase: 125 U/L (ref 38–126)
Anion gap: 7 (ref 5–15)
BUN: 7 mg/dL (ref 6–20)
CO2: 23 mmol/L (ref 22–32)
Calcium: 9.7 mg/dL (ref 8.9–10.3)
Chloride: 104 mmol/L (ref 98–111)
Creatinine, Ser: 0.66 mg/dL (ref 0.44–1.00)
GFR, Estimated: >60 mL/min (ref >60)
Glucose, Bld: 99 mg/dL (ref 70–99)
Potassium: 4.2 mmol/L (ref 3.5–5.1)
Sodium: 134 mmol/L — ABNORMAL LOW (ref 135–145)
Total Bilirubin: 0.8 mg/dL (ref 0.3–1.2)
Total Protein: 6 g/dL — ABNORMAL LOW (ref 6.5–8.1)

## 2021-01-10 LAB — TYPE AND SCREEN
ABO/RH(D): O POS
Antibody Screen: NEGATIVE

## 2021-01-10 MED ORDER — LACTATED RINGERS IV SOLN: 500.0000 mL | Freq: Once | INTRAVENOUS | Status: DC

## 2021-01-10 MED ORDER — SODIUM CHLORIDE 0.9 % IV SOLN
5.0000 10*6.[IU] | Freq: Once | INTRAVENOUS | Status: AC
  Administered 2021-01-10: 5 10*6.[IU] via INTRAVENOUS
  Filled 2021-01-10: qty 5

## 2021-01-10 MED ORDER — LIDOCAINE HCL (PF) 1 % IJ SOLN
30.0000 mL | INTRAMUSCULAR | Status: AC | PRN
  Administered 2021-01-10: 30 mL via SUBCUTANEOUS
  Filled 2021-01-10: qty 30

## 2021-01-10 MED ORDER — DIPHENHYDRAMINE HCL 25 MG PO CAPS: 25.0000 mg | ORAL_CAPSULE | Freq: Four times a day (QID) | ORAL | Status: DC | PRN

## 2021-01-10 MED ORDER — BENZOCAINE-MENTHOL 20-0.5 % EX AERO
1.0000 "application " | INHALATION_SPRAY | CUTANEOUS | Status: DC | PRN
  Administered 2021-01-10: 1 via TOPICAL
  Filled 2021-01-10: qty 56

## 2021-01-10 MED ORDER — ONDANSETRON HCL 4 MG/2ML IJ SOLN
4.0000 mg | Freq: Four times a day (QID) | INTRAMUSCULAR | Status: DC | PRN
  Administered 2021-01-10: 4 mg via INTRAVENOUS
  Filled 2021-01-10: qty 2

## 2021-01-10 MED ORDER — ONDANSETRON HCL 4 MG PO TABS: 4.0000 mg | ORAL_TABLET | ORAL | Status: DC | PRN

## 2021-01-10 MED ORDER — TERBUTALINE SULFATE 1 MG/ML IJ SOLN: 0.2500 mg | Freq: Once | INTRAMUSCULAR | Status: DC | PRN

## 2021-01-10 MED ORDER — MISOPROSTOL 50MCG HALF TABLET
ORAL_TABLET | ORAL | Status: AC
  Filled 2021-01-10: qty 1

## 2021-01-10 MED ORDER — OXYTOCIN BOLUS FROM INFUSION
333.0000 mL | Freq: Once | INTRAVENOUS | Status: AC
  Administered 2021-01-10: 333 mL via INTRAVENOUS

## 2021-01-10 MED ORDER — PENICILLIN G POT IN DEXTROSE 60000 UNIT/ML IV SOLN
3.0000 10*6.[IU] | INTRAVENOUS | Status: DC
Start: 2021-01-10 — End: 2021-01-10
  Administered 2021-01-10 (×2): 3 10*6.[IU] via INTRAVENOUS
  Filled 2021-01-10 (×2): qty 50

## 2021-01-10 MED ORDER — FENTANYL CITRATE (PF) 100 MCG/2ML IJ SOLN
100.0000 ug | INTRAMUSCULAR | Status: DC | PRN
Start: 2021-01-10 — End: 2021-01-10
  Administered 2021-01-10 (×3): 100 ug via INTRAVENOUS
  Filled 2021-01-10 (×2): qty 2

## 2021-01-10 MED ORDER — SIMETHICONE 80 MG PO CHEW: 80.0000 mg | CHEWABLE_TABLET | ORAL | Status: DC | PRN

## 2021-01-10 MED ORDER — FENTANYL-BUPIVACAINE-NACL 0.5-0.125-0.9 MG/250ML-% EP SOLN: 12.0000 mL/h | EPIDURAL | Status: DC | PRN

## 2021-01-10 MED ORDER — EPHEDRINE 5 MG/ML INJ: 10.0000 mg | INTRAVENOUS | Status: DC | PRN

## 2021-01-10 MED ORDER — SENNOSIDES-DOCUSATE SODIUM 8.6-50 MG PO TABS
2.0000 | ORAL_TABLET | Freq: Every day | ORAL | Status: DC
  Administered 2021-01-11: 2 via ORAL
  Filled 2021-01-10 (×2): qty 2

## 2021-01-10 MED ORDER — OXYTOCIN-SODIUM CHLORIDE 30-0.9 UT/500ML-% IV SOLN: 1.0000 m[IU]/min | INTRAVENOUS | Status: DC

## 2021-01-10 MED ORDER — FENTANYL CITRATE (PF) 100 MCG/2ML IJ SOLN
INTRAMUSCULAR | Status: AC
  Filled 2021-01-10: qty 2

## 2021-01-10 MED ORDER — PHENYLEPHRINE 40 MCG/ML (10ML) SYRINGE FOR IV PUSH (FOR BLOOD PRESSURE SUPPORT): 80.0000 ug | PREFILLED_SYRINGE | INTRAVENOUS | Status: DC | PRN

## 2021-01-10 MED ORDER — OXYTOCIN-SODIUM CHLORIDE 30-0.9 UT/500ML-% IV SOLN
2.5000 [IU]/h | INTRAVENOUS | Status: DC
  Administered 2021-01-10: 2.5 [IU]/h via INTRAVENOUS
  Filled 2021-01-10: qty 500

## 2021-01-10 MED ORDER — COCONUT OIL OIL
1.0000 "application " | TOPICAL_OIL | Status: DC | PRN
  Administered 2021-01-11: 1 via TOPICAL

## 2021-01-10 MED ORDER — LACTATED RINGERS IV SOLN: 500.0000 mL | INTRAVENOUS | Status: DC | PRN

## 2021-01-10 MED ORDER — ACETAMINOPHEN 325 MG PO TABS
650.0000 mg | ORAL_TABLET | ORAL | Status: DC | PRN
  Administered 2021-01-10: 650 mg via ORAL
  Filled 2021-01-10: qty 2

## 2021-01-10 MED ORDER — PRENATAL MULTIVITAMIN CH
1.0000 | ORAL_TABLET | Freq: Every day | ORAL | Status: DC
  Administered 2021-01-11 – 2021-01-12 (×2): 1 via ORAL
  Filled 2021-01-10 (×2): qty 1

## 2021-01-10 MED ORDER — MISOPROSTOL 50MCG HALF TABLET
50.0000 ug | ORAL_TABLET | ORAL | Status: DC | PRN
  Administered 2021-01-10: 50 ug via BUCCAL
  Filled 2021-01-10: qty 1

## 2021-01-10 MED ORDER — IBUPROFEN 600 MG PO TABS
600.0000 mg | ORAL_TABLET | Freq: Four times a day (QID) | ORAL | Status: DC
  Administered 2021-01-10 – 2021-01-12 (×8): 600 mg via ORAL
  Filled 2021-01-10 (×8): qty 1

## 2021-01-10 MED ORDER — OXYCODONE-ACETAMINOPHEN 5-325 MG PO TABS: 1.0000 | ORAL_TABLET | ORAL | Status: DC | PRN

## 2021-01-10 MED ORDER — MISOPROSTOL 25 MCG QUARTER TABLET: 25.0000 ug | ORAL_TABLET | ORAL | Status: DC | PRN

## 2021-01-10 MED ORDER — OXYCODONE-ACETAMINOPHEN 5-325 MG PO TABS: 2.0000 | ORAL_TABLET | ORAL | Status: DC | PRN

## 2021-01-10 MED ORDER — DIBUCAINE (PERIANAL) 1 % EX OINT: 1.0000 "application " | TOPICAL_OINTMENT | CUTANEOUS | Status: DC | PRN

## 2021-01-10 MED ORDER — ACETAMINOPHEN 325 MG PO TABS: 650.0000 mg | ORAL_TABLET | ORAL | Status: DC | PRN

## 2021-01-10 MED ORDER — DIPHENHYDRAMINE HCL 50 MG/ML IJ SOLN: 12.5000 mg | INTRAMUSCULAR | Status: DC | PRN

## 2021-01-10 MED ORDER — LACTATED RINGERS IV SOLN: INTRAVENOUS | Status: DC

## 2021-01-10 MED ORDER — SOD CITRATE-CITRIC ACID 500-334 MG/5ML PO SOLN: 30.0000 mL | ORAL | Status: DC | PRN

## 2021-01-10 MED ORDER — WITCH HAZEL-GLYCERIN EX PADS: 1.0000 "application " | MEDICATED_PAD | CUTANEOUS | Status: DC | PRN

## 2021-01-10 MED ORDER — ONDANSETRON HCL 4 MG/2ML IJ SOLN: 4.0000 mg | INTRAMUSCULAR | Status: DC | PRN

## 2021-01-10 NOTE — Progress Notes (Addendum)
LABOR PROGRESS NOTE  Linda Nunez is a 25 y.o. G1P0 at [redacted]w[redacted]d  presented for IOL for PROM w/ possible early labor.  Subjective: Uncomfortable. Feeling increasing pressure during contractions.  Objective: BP 135/82 (BP Location: Left Arm)   Pulse (!) 102   Temp 98.5 F (36.9 C) (Oral)   Resp 20   Ht 5\' 4"  (1.626 m)   Wt 91.3 kg   LMP 03/23/2020   BMI 34.55 kg/m  or  Vitals:   01/10/21 0613 01/10/21 0730 01/10/21 0820 01/10/21 0931  BP:  127/68 135/65 135/82  Pulse:  97 (!) 112 (!) 102  Resp:  20  20  Temp: 98.5 F (36.9 C) 98.5 F (36.9 C)    TempSrc: Oral Oral    Weight:      Height:       Dilation: 8 Effacement (%): 90 Station: -1 Presentation: Vertex Exam by:: H.Price, RN FHT: baseline rate 125, moderate variability, -accels, -decels Toco: q2-44min  Labs: Lab Results  Component Value Date   WBC 6.6 01/10/2021   HGB 12.0 01/10/2021   HCT 34.3 (L) 01/10/2021   MCV 92.5 01/10/2021   PLT 241 01/10/2021    Patient Active Problem List   Diagnosis Date Noted   Premature rupture of membranes 01/10/2021   History of sexually transmitted disease 09/26/2020   Supervision of normal first pregnancy 05/31/2020   PCOS (polycystic ovarian syndrome) 08/25/2019    Assessment / Plan: 25 y.o. G1P0 at [redacted]w[redacted]d here for IOL for PROM w/ possible early labor.   Labor: Progressing well. Continue to monitor.  Fetal Wellbeing:  Category I Pain Control:  prn. Interested in nitrous oxide, not epidural. Anticipated MOD:  Vaginal #GBS positive > PCN  [redacted]w[redacted]d, DO 01/10/2021, 9:38 AM PGY-1, [facility]

## 2021-01-10 NOTE — H&P (Addendum)
OBSTETRIC ADMISSION HISTORY AND PHYSICAL  Linda Nunez is a 25 y.o. female G1P0 with IUP at [redacted]w[redacted]d by early [place] presenting for premature rupture of membranes at 1120pm on 10/20. She reports leaking watery fluid after standing up from sofa at home. She was fern positive in the MAU.  She reports mild contractions >76min apart minutes. Reduced FM's. Floaters noted earlier while in MAU, but currently not present. Reports mild pedal edema. No VB, no blurry vision, headaches or RUQ pain.  She plans on breast feeding. She request nexplanon for birth control.  She received her prenatal care at [facility]  Dating: By early ultrasound --->  Estimated Date of Delivery: 01/15/21  Sono:   @[redacted]w[redacted]d , CWD, normal anatomy, cephalic presentation, placenta anterior, 711g, 54% EFW   Prenatal History/Complications: PCOS, obesity in pregnancy  Past Medical History: Past Medical History:  Diagnosis Date   Chlamydia    History of PCOS    PCOS (polycystic ovarian syndrome)     Past Surgical History: Past Surgical History:  Procedure Laterality Date   NO PAST SURGERIES      Obstetrical History: OB History     Gravida  1   Para      Term      Preterm      AB      Living         SAB      IAB      Ectopic      Multiple      Live Births              Social History Social History   Socioeconomic History   Marital status: Single    Spouse name: Not on file   Number of children: Not on file   Years of education: Not on file   Highest education level: Not on file  Occupational History   Not on file  Tobacco Use   Smoking status: Former    Types: Cigarettes   Smokeless tobacco: Never  Vaping Use   Vaping Use: Former   Substances: THC, CBD  Substance and Sexual Activity   Alcohol use: Yes    Comment: social    Drug use: Yes    Frequency: 7.0 times per week    Types: Marijuana    Comment: 05/28/2020   Sexual activity: Yes    Birth control/protection: None  Other Topics  Concern   Not on file  Social History Narrative   Not on file   Social Determinants of Health   Financial Resource Strain: Not on file  Food Insecurity: Not on file  Transportation Needs: Not on file  Physical Activity: Not on file  Stress: Not on file  Social Connections: Not on file    Family History: Family History  Problem Relation Age of Onset   Asthma Mother    Diabetes Father     Allergies: Allergies  Allergen Reactions   Latex Itching and Rash    Medications Prior to Admission  Medication Sig Dispense Refill Last Dose   aspirin EC 81 MG tablet Take 1 tablet (81 mg total) by mouth daily. Swallow whole. 30 tablet 11 01/09/2021   Prenatal Vit-Fe Fumarate-FA (PRENATAL VITAMIN) 27-0.8 MG TABS Take once daily 30 tablet 10 01/09/2021   polyethylene glycol (MIRALAX / GLYCOLAX) 17 g packet Take 17 g by mouth daily.   More than a month     Review of Systems   All systems reviewed and negative except as stated in  HPI  Blood pressure 127/85, pulse (!) 108, temperature 98.6 F (37 C), temperature source Oral, resp. rate 18, height 5\' 4"  (1.626 m), weight 91.3 kg, last menstrual period 03/23/2020, unknown if currently breastfeeding. General appearance: alert, cooperative, and no distress Lungs: normal work of breathing Heart: regular rate and rhythm Abdomen: gravid, non-tender Pelvic: see cervical exam below Extremities: No edema, no sign of DVT Presentation: cephalic Fetal monitoringBaseline: 140 bpm, Variability: Good {> 6 bpm), Accelerations: Reactive, and Decelerations: Absent Uterine activityFrequency: Every 3-7 minutes and Intensity: mild     Prenatal labs: ABO, Rh: O/Positive/-- (03/08 1658) Antibody: Negative (03/08 1658) Rubella: 3.05 (03/08 1658) RPR: Non Reactive (08/01 1415)  HBsAg: Negative (03/08 1658)  HIV: Non Reactive (08/01 1415)  GBS: Positive/-- (10/03 1226)  Early and 3rd trimester 3hr glucola normal Genetic screening: NIPS low  risk Anatomy 05-17-1988 normal  Prenatal Transfer Tool  Maternal Diabetes: No Genetic Screening: Normal Maternal Ultrasounds/Referrals: Normal Fetal Ultrasounds or other Referrals:  None Maternal Substance Abuse:  No Significant Maternal Medications:  None Significant Maternal Lab Results: Group B Strep positive  No results found for this or any previous visit (from the past 24 hour(s)).  Patient Active Problem List   Diagnosis Date Noted   History of sexually transmitted disease 09/26/2020   Supervision of normal first pregnancy 05/31/2020   PCOS (polycystic ovarian syndrome) 08/25/2019    Assessment/Plan:  10/25/2019 is a 25 y.o. G1P0 at [redacted]w[redacted]d here for IOL for PROM with possible early labor  #Labor: likely early labor. SROM at home at 1120pm on 10/20. Will begin augmentation with Cytotec. Patient would like to defer foley bulb until next check. Recheck in ~4hrs. Reconsider foley bulb vs Cytotec alone at that time.  #Obesity in pregnancy: BMI 34.55. taking bASA for preeclampsia prevention.   #Vision changes: Reports visual floaters in earlier this evening which have since resolved. Was told BP was elevated at office at several visits, but always resolved on recheck. Blood pressures normotensive here. She is at risk for preE, so will go ahead and check CMP in addition to CBC. Will not get urine protein to creatinine ratio as invalid due to ruptured membranes. - continue to monitor. - Follow up CMP  #Pain: Prefers unmedicated, but open to pain medicine if needed later. #FWB: Cat 1 #ID:  GBS+ > PCN started #MOF: breast #MOC:nexplanon #Circ:  N/A  11/20, MD, PGY-1, Faculty Service 01/10/2021, 12:33 AM  GME ATTESTATION:  I saw and evaluated the patient. I agree with the findings and the plan of care as documented in the resident's note and have made any necessary edits.  01/12/2021, MD, MPH OB Fellow, Faculty Practice [facility], Center for [facility]  Healthcare 01/10/2021 2:58 AM

## 2021-01-10 NOTE — MAU Provider Note (Signed)
Pt informed that the ultrasound is considered a limited OB ultrasound and is not intended to be a complete ultrasound exam.  Patient also informed that the ultrasound is not being completed with the intent of assessing for fetal or placental anomalies or any pelvic abnormalities.  Explained that the purpose of today's ultrasound is to assess for presentation.  Patient acknowledges the purpose of the exam and the limitations of the study.    Fetus is in the vertex presentation Longitudinal Lie

## 2021-01-10 NOTE — Lactation Note (Signed)
This note was copied from a baby's chart. Lactation Consultation Note  Patient Name: Linda Nunez Today's Date: 01/10/2021   Age:26 hours  I assisted with latching infant while Mom was on L&D. Infant latched with ease & suckled well. Jaw movement suggested that spontaneous swallowing was occurring.   Mom has [facility] & does not have a pump for home use. I did let Mom know we could also give her a hand pump while here. Mom will need a size 21 flange when pumping.   Lactation to f/u later today.   Lurline Hare [facility] 01/10/2021, 11:06 AM

## 2021-01-10 NOTE — Discharge Summary (Signed)
   Postpartum Discharge Summary  Date of Service updated-yes     Patient Name: Linda Nunez DOB: 07/28/1995 MRN: [id number]  Date of admission: 01/09/2021 Delivery date:01/10/2021  Delivering provider: NEILL, CAROLINE M  Date of discharge: 01/12/2021  Admitting diagnosis: Premature rupture of membranes [O42.90] Intrauterine pregnancy: [redacted]w[redacted]d     Secondary diagnosis:  Active Problems:   PCOS (polycystic ovarian syndrome)   Supervision of normal first pregnancy   Premature rupture of membranes   Vaginal delivery  Additional problems: Obesity    Discharge diagnosis: Term Pregnancy Delivered                                              Post partum procedures: n/a Augmentation: Cytotec Complications: None  Hospital course: Onset of Labor With Vaginal Delivery      25 y.o. yo G1P0 at [redacted]w[redacted]d was admitted in Latent Labor on 01/09/2021. Patient had an uncomplicated labor course as follows: cytotec x1 and progressed to complete without further augmentation Membrane Rupture Time/Date: 11:20 PM ,01/09/2021   Delivery Method:Vaginal, Spontaneous  Episiotomy: None  Lacerations:  1st degree;Perineal  Patient had an uncomplicated postpartum course.  She is ambulating, tolerating a regular diet, passing flatus, and urinating well. Patient is discharged home in stable condition on 01/12/21.  Newborn Data: Birth date:01/10/2021  Birth time:10:16 AM  Gender:Female  Living status:Living  Apgars:8 ,9  Weight:3130 g   Magnesium Sulfate received: No BMZ received: No Rhophylac:N/A MMR:No T-DaP:Given prenatally Flu: Yes Transfusion:No  Physical exam  Vitals:   01/11/21 0540 01/11/21 1502 01/11/21 1950 01/12/21 0545  BP: 120/65 109/80 111/86 121/84  Pulse: 98 98 92 85  Resp: 16 16 17   Temp: 99.2 F (37.3 C) 98.7 F (37.1 C) 98.8 F (37.1 C)   TempSrc: Oral Oral Oral   SpO2: 99% 100%    Weight:      Height:       General: alert, cooperative, and no distress Lochia:  appropriate Uterine Fundus: firm Incision: N/A DVT Evaluation: No evidence of DVT seen on physical exam. Negative Homan's sign. No cords or calf tenderness. No significant calf/ankle edema. Labs: Lab Results  Component Value Date   WBC 6.6 01/10/2021   HGB 12.0 01/10/2021   HCT 34.3 (L) 01/10/2021   MCV 92.5 01/10/2021   PLT 241 01/10/2021   CMP Latest Ref Rng & Units 01/10/2021  Glucose 70 - 99 mg/dL 99  BUN 6 - 20 mg/dL 7  Creatinine 0.44 - 1.00 mg/dL 0.66  Sodium 135 - 145 mmol/L 134(L)  Potassium 3.5 - 5.1 mmol/L 4.2  Chloride 98 - 111 mmol/L 104  CO2 22 - 32 mmol/L 23  Calcium 8.9 - 10.3 mg/dL 9.7  Total Protein 6.5 - 8.1 g/dL 6.0(L)  Total Bilirubin 0.3 - 1.2 mg/dL 0.8  Alkaline Phos 38 - 126 U/L 125  AST 15 - 41 U/L 21  ALT 0 - 44 U/L 14   Edinburgh Score: Edinburgh Postnatal Depression Scale Screening Tool 01/11/2021  I have been able to laugh and see the funny side of things. 0  I have looked forward with enjoyment to things. 0  I have blamed myself unnecessarily when things went wrong. 0  I have been anxious or worried for no good reason. 0  I have felt scared or panicky for no good reason. 0  Things have been getting   on top of me. 0  I have been so unhappy that I have had difficulty sleeping. 0  I have felt sad or miserable. 0  I have been so unhappy that I have been crying. 0  The thought of harming myself has occurred to me. 0  Edinburgh Postnatal Depression Scale Total 0     After visit meds:  Allergies as of 01/12/2021       Reactions   Latex Itching, Rash        Medication List     STOP taking these medications    aspirin EC 81 MG tablet       TAKE these medications    ibuprofen 600 MG tablet Commonly known as: ADVIL Take 1 tablet (600 mg total) by mouth every 6 (six) hours.   polyethylene glycol 17 g packet Commonly known as: MIRALAX / GLYCOLAX Take 17 g by mouth daily.   Prenatal Vitamin 27-0.8 MG Tabs Take once daily          Discharge home in stable condition Infant Feeding: Breast Infant Disposition:home with mother Discharge instruction: per After Visit Summary and Postpartum booklet. Activity: Advance as tolerated. Pelvic rest for 6 weeks.  Diet: routine diet Future Appointments: Future Appointments  Date Time Provider [place]  01/21/2021  2:30 PM Donney Dice, DO [facility] [place]   Follow up Visit:   Please schedule this patient for a In person postpartum visit in 4 weeks with the following provider: Any provider. Additional Postpartum F/U: n/a   Low risk pregnancy complicated by:  none Delivery mode:  Vaginal, Spontaneous  Anticipated Birth Control:  Nexplanon placed inpatient   01/12/2021 Julianne Handler, CNM

## 2021-01-10 NOTE — Lactation Note (Signed)
This note was copied from a baby's chart. Lactation Consultation Note  Patient Name: [name] Today's Date: 01/10/2021 Reason for consult: Initial assessment;Term;Primapara;1st time breastfeeding Age:26 hours   P1 mother whose infant is now 74 hours old.  This is a term baby at 39+2 weeks.  RN and student in room to assess baby.  Reviewed breast feeding basics with mother.  She has been taught hand expression but is not able to see colostrum drops at this time.  Encouraged continued practice, especially before/after feedings to help with milk supply.  Mother reported a good feeding after delivery.  Mother will feed 8-12 times/24 hours or sooner if baby shows feeding cues.  Reviewed cues with parents.  Suggested mother call her RN/LC for latch assistance as needed.    Mom made aware of O/P services, breastfeeding support groups, community resources, and our phone # for post-discharge questions.  Father present.     Maternal Data Has patient been taught Hand Expression?: Yes Does the patient have breastfeeding experience prior to this delivery?: No  Feeding Mother's Current Feeding Choice: Breast Milk  LATCH Score Latch: Grasps breast easily, tongue down, lips flanged, rhythmical sucking.  Audible Swallowing: A few with stimulation  Type of Nipple: Everted at rest and after stimulation  Comfort (Breast/Nipple): Soft / non-tender  Hold (Positioning): No assistance needed to correctly position infant at breast.  LATCH Score: 9   Lactation Tools Discussed/Used    Interventions Interventions: Breast feeding basics reviewed;Education  Discharge Pump:  (Mom does not have a pump at home) [facility] Program: Yes  Consult Status Consult Status: Follow-up Date: 01/11/21 Follow-up type: In-patient    Sorren Vallier R Nechemia Chiappetta 01/10/2021, 3:01 PM

## 2021-01-11 MED ORDER — ETONOGESTREL 68 MG ~~LOC~~ IMPL
68.0000 mg | DRUG_IMPLANT | Freq: Once | SUBCUTANEOUS | Status: AC
  Administered 2021-01-12: 68 mg via SUBCUTANEOUS
  Filled 2021-01-11 (×2): qty 1

## 2021-01-11 MED ORDER — LIDOCAINE HCL 1 % IJ SOLN
0.0000 mL | Freq: Once | INTRAMUSCULAR | Status: AC | PRN
  Administered 2021-01-12: 20 mL via INTRADERMAL
  Filled 2021-01-11 (×2): qty 20

## 2021-01-11 NOTE — Progress Notes (Signed)
Post Partum Day 1 Subjective: no complaints, up ad lib, voiding, tolerating PO, and + flatus  Objective: Blood pressure 120/65, pulse 98, temperature 99.2 F (37.3 C), temperature source Oral, resp. rate 16, height 5\' 4"  (1.626 m), weight 91.3 kg, last menstrual period 03/23/2020, SpO2 99 %, unknown if currently breastfeeding.  Physical Exam:  General: alert, cooperative, and no distress Lochia: appropriate Uterine Fundus: firm Incision: n/a DVT Evaluation: No evidence of DVT seen on physical exam.  Recent Labs    01/10/21 0041  HGB 12.0  HCT 34.3*    Assessment/Plan: Plan for discharge tomorrow, Breastfeeding, and Contraception Nexplanon   LOS: 1 day   01/12/21 CNM 01/11/2021, 9:45 AM

## 2021-01-11 NOTE — Lactation Note (Signed)
This note was copied from a baby's chart. Lactation Consultation Note  Patient Name: Linda Nunez [id number] Date: 01/11/2021 Reason for consult: Follow-up assessment;Nipple pain/trauma Age:26 hours  LC in to room for follow up. Mother states breastfeeding is going well. Mother reports right nipple discomfort. Noted abrasion at base of right nipple. Encouraged to use own colostrum, air-dry and apply comfort gels/coconut oil (but separately)   Plan: 1-Breastfeeding on demand or 8-12 times in 24h period. 2-Nipple care as recommended 3-Reinforce maternal rest, hydration and food intake.   Contact LC as needed for feeds/support/concerns/questions. All questions answered at this time.    Maternal Data Has patient been taught Hand Expression?: Yes  Feeding Mother's Current Feeding Choice: Breast Milk  Interventions Interventions: Breast feeding basics reviewed;Hand express;Expressed milk;Education;Coconut oil;Comfort gels  Discharge Discharge Education: Engorgement and breast care  Consult Status Consult Status: Follow-up Date: 01/12/21 Follow-up type: In-patient    Linda Nunez 01/11/2021, 9:27 PM

## 2021-01-12 DIAGNOSIS — Z30017 Encounter for initial prescription of implantable subdermal contraceptive: Secondary | ICD-10-CM

## 2021-01-12 MED ORDER — IBUPROFEN 600 MG PO TABS: 600.0000 mg | ORAL_TABLET | Freq: Four times a day (QID) | ORAL | 0 refills | Status: DC

## 2021-01-12 NOTE — Procedures (Signed)
GYNECOLOGY PROCEDURE NOTE  Linda Nunez is a 25 y.o. G1P1001 requesting Nexplanon insertion. No gynecologic concerns.  Nexplanon Insertion Procedure Patient identified, informed consent performed, consent signed. Patient does understand that irregular bleeding is a very common side effect of this medication. She was advised to have backup contraception for one week after placement. Appropriate time out taken. Patient's left arm was prepped and draped in the usual sterile fashion. The insertion area was measured and marked. Patient was prepped with alcohol swab and then injected with 3 ml of 1% lidocaine. The area was then prepped with betadine. Nexplanon removed from packaging and device confirmed present within needle, then inserted full length of needle and withdrawn per handbook instructions. Nexplanon was able to palpated in the patient's arm; patient palpated the insert herself. There was minimal blood loss. Patient insertion site covered with steri strip, guaze, and a pressure bandage to reduce any bruising. The patient tolerated the procedure well and was given post procedure instructions.

## 2021-01-13 ENCOUNTER — Telehealth: Payer: Self-pay

## 2021-01-13 NOTE — Telephone Encounter (Signed)
Transition Care Management Unsuccessful Follow-up Telephone Call  Date of discharge and from where:  01/12/2021-[facility]  Attempts:  1st Attempt  Reason for unsuccessful TCM follow-up call:  Left voice message

## 2021-01-14 NOTE — Telephone Encounter (Signed)
Transition Care Management Unsuccessful Follow-up Telephone Call  Date of discharge and from where:  01/12/2021-[facility]  Attempts:  2nd Attempt  Reason for unsuccessful TCM follow-up call:  Left voice message

## 2021-01-15 NOTE — Telephone Encounter (Signed)
Transition Care Management Unsuccessful Follow-up Telephone Call  Date of discharge and from where:  01/12/2021-[facility]  Attempts:  3rd Attempt  Reason for unsuccessful TCM follow-up call:  Left voice message

## 2021-01-17 ENCOUNTER — Ambulatory Visit: Payer: MEDICAID

## 2021-01-21 ENCOUNTER — Other Ambulatory Visit: Payer: Self-pay

## 2021-01-21 ENCOUNTER — Ambulatory Visit (INDEPENDENT_AMBULATORY_CARE_PROVIDER_SITE_OTHER): Payer: Medicaid Other | Admitting: Family Medicine

## 2021-01-21 ENCOUNTER — Telehealth (HOSPITAL_COMMUNITY): Payer: Self-pay | Admitting: *Deleted

## 2021-01-21 ENCOUNTER — Encounter: Payer: Self-pay | Admitting: Family Medicine

## 2021-01-21 NOTE — Patient Instructions (Signed)
It was great seeing you today!  Congratulations on the baby! I am so glad you are doing well and have great support at home.  Please follow up at your next scheduled appointment in 3 weeks, if anything arises between now and then, please don't hesitate to contact our office.   Thank you for allowing [place] to be a part of your medical care!  Thank you, Dr. Robyne Peers

## 2021-01-21 NOTE — Telephone Encounter (Signed)
Patient voiced no questions or concerns regarding her own health. EPDS = 0. Patient voiced no questions or concerns regarding baby at this time. Patient reported infant sleeps in a bassinet on his back. RN reviewed ABCs of safe sleep - patient verbalized understanding. Patient requested RN email information on hospital's virtual postpartum classes and support groups. Email sent. Deforest Hoyles, RN, 01/21/21, [phone]

## 2021-01-21 NOTE — Progress Notes (Signed)
    SUBJECTIVE:   CHIEF COMPLAINT / HPI:   Patient presents for postpartum follow up. She believes this was supposed to be an expectant management visit but she has already delivered her baby successfully via spontaneous vaginal delivery without complications. Had nexplanon placed shortly after delivery. Breastfeeding exclusively without issues, sometimes pumps to get better expression of milk. Doing well mood wise, baby keeps her up at night but feels well prepared. This is first baby and is feeling good about handling everything. Denies history of depression and anxiety prior to delivery. Lives with mother, father and sister so has plenty of support at home. Denies any concerns at this time.   OBJECTIVE:   BP 108/70   Pulse 78   Ht 5\' 4"  (1.626 m)   Wt 186 lb 8 oz (84.6 kg)   LMP 03/23/2020   SpO2 100%   BMI 32.01 kg/m    General: Patient well-appearing, in no acute distress. CV: RRR, no murmurs or gallops auscultated Resp: CTAB Abdomen: soft, nontender, presence of bowel sounds Neuro: normal gait Psych: pleasant, mood appropriate, denies SI or HI   ASSESSMENT/PLAN:   Postpartum care -doing well, reassurance provided  -instructed to continue prenatal vitamin  -instructed to follow up in 3 weeks for postpartum visit, will need Edinburgh at next visit  -nexplanon in place for contraception   05/21/2020, DO [facility]

## 2021-01-22 ENCOUNTER — Inpatient Hospital Stay (HOSPITAL_COMMUNITY): Payer: Medicaid Other

## 2021-01-22 ENCOUNTER — Inpatient Hospital Stay (HOSPITAL_COMMUNITY)
Admission: AD | Admit: 2021-01-22 | Payer: Medicaid Other | Source: Home / Self Care | Admitting: Obstetrics and Gynecology

## 2021-02-15 ENCOUNTER — Other Ambulatory Visit: Payer: Self-pay | Admitting: Certified Nurse Midwife

## 2021-02-15 ENCOUNTER — Encounter: Payer: Self-pay | Admitting: Family Medicine

## 2021-02-17 MED ORDER — IBUPROFEN 600 MG PO TABS: 600.0000 mg | ORAL_TABLET | Freq: Four times a day (QID) | ORAL | 0 refills | Status: DC

## 2021-02-18 ENCOUNTER — Encounter: Payer: Self-pay | Admitting: Family Medicine

## 2021-02-18 ENCOUNTER — Ambulatory Visit (INDEPENDENT_AMBULATORY_CARE_PROVIDER_SITE_OTHER): Payer: Medicaid Other | Admitting: Family Medicine

## 2021-02-18 ENCOUNTER — Other Ambulatory Visit: Payer: Self-pay

## 2021-02-18 NOTE — Patient Instructions (Signed)
It was great seeing you today!   I'd like to see you back 2 weeks if your bleeding has not stopped. We may need to consider some oral birth control or hormonal pills to reset your period to allow you to restart naturally.    If you have questions or concerns please do not hesitate to call at [phone].  Dr. Katherina Right [facility]

## 2021-02-18 NOTE — Assessment & Plan Note (Addendum)
Pt delivered on 01/10/21 and continues to have vaginal bleeding. Appears to be cervical/uteral on exam. Per L&D delivery note placenta was intact. Pt reports no complications. Follow up in 2 weeks if not stopped. May need trial of hormonal therapy to cease bleeding. Has Nexplanon. Edinburgh score 3.

## 2021-02-18 NOTE — Progress Notes (Signed)
   SUBJECTIVE:   CHIEF COMPLAINT / HPI:   Chief Complaint  Patient presents with   Postpartum Care     Linda Nunez is a 25 y.o. female here for postpartum follow up. Pt delivered healthy baby girl vaginally 5 weeks ago. Contiunes to breastfeed but reports painful feeds. Has started of bottled breast milk. Pt reports continued pelvic pain. She has pain when she walks. "Feels like something rubbing together or like something is coming out." Had a 1st degree tear. Taking ibuprofen as needed for pain.   Stopped bleeding last night but had some spotting today. Has Nexplanon for contraception. Denies lightheadedness, chest pain, shortness of breath. Feels like her normal self.    PERTINENT  PMH / PSH: reviewed and updated as appropriate   OBJECTIVE:   BP 110/64   Pulse 85   Ht 5\' 4"  (1.626 m)   Wt 187 lb 6 oz (85 kg)   LMP 03/23/2020   SpO2 98%   BMI 32.16 kg/m    GEN: well appearing female in no acute distress  CVS: well perfused  RESP: speaking in full sentences without pause  ABD: soft, non-tender, non-distended, no palpable masses  Pelvic exam: normal external genitalia, vulva, VAGINA blood in the vaginal vault and CERVIX dark blood from the external os, no vaginal or cervical lesions, ADNEXA: not assessed,   exam chaperoned by CMA Tashira .    ASSESSMENT/PLAN:   Postpartum care and examination Pt delivered on 01/10/21 and continues to have vaginal bleeding. Appears to be cervical/uteral on exam. Per L&D delivery note placenta was intact. Pt reports no complications. Follow up in 2 weeks if not stopped. May need trial of hormonal therapy to cease bleeding. Has Nexplanon. Edinburgh score 3.  Breast feeding.   01/12/21, DO   PGY-3, [facility] 02/18/2021

## 2021-04-06 ENCOUNTER — Other Ambulatory Visit: Payer: Self-pay | Admitting: Family Medicine

## 2021-04-07 ENCOUNTER — Encounter: Payer: Self-pay | Admitting: Family Medicine

## 2021-04-07 ENCOUNTER — Ambulatory Visit (INDEPENDENT_AMBULATORY_CARE_PROVIDER_SITE_OTHER): Payer: Medicaid Other | Admitting: Family Medicine

## 2021-04-07 ENCOUNTER — Other Ambulatory Visit: Payer: Self-pay

## 2021-04-07 VITALS — BP 110/69 | HR 75 | Ht 64.0 in

## 2021-04-07 DIAGNOSIS — L309 Dermatitis, unspecified: Secondary | ICD-10-CM | POA: Diagnosis not present

## 2021-04-07 DIAGNOSIS — R112 Nausea with vomiting, unspecified: Secondary | ICD-10-CM | POA: Diagnosis not present

## 2021-04-07 DIAGNOSIS — J069 Acute upper respiratory infection, unspecified: Secondary | ICD-10-CM | POA: Diagnosis not present

## 2021-04-07 DIAGNOSIS — J029 Acute pharyngitis, unspecified: Secondary | ICD-10-CM

## 2021-04-07 LAB — POCT INFLUENZA A/B
Influenza A, POC: NEGATIVE
Influenza B, POC: NEGATIVE

## 2021-04-07 MED ORDER — ONDANSETRON HCL 4 MG PO TABS: 4.0000 mg | ORAL_TABLET | Freq: Three times a day (TID) | ORAL | 0 refills | Status: DC | PRN

## 2021-04-07 MED ORDER — IBUPROFEN 600 MG PO TABS: 600.0000 mg | ORAL_TABLET | Freq: Four times a day (QID) | ORAL | 0 refills | Status: DC

## 2021-04-07 NOTE — Assessment & Plan Note (Signed)
Patient is currently breast feeding and has dry skin on both nipples, no redness/fluctuance/heat. Discussed returned precautions. Recommend vaseline or cocoa butter. F/u if not improved

## 2021-04-07 NOTE — Progress Notes (Signed)
° ° °  SUBJECTIVE:   CHIEF COMPLAINT / HPI:   Primary symptom: sore throat, body aches, cough, nausea and some vomiting. This started 1/10 and patient's 27 yo infant tested positive for COVID at home. She has been hydrating well, took robitussin, she did have chills, but no fever she is aware of and chills have resolved. No trouble breathing. Duration:6 days Severity:mild Associated symptoms: see above Fever? Tmax?: n/a Sick contacts: infant see above Covid test: pending Covid vaccination(s): none  Dry skin: patient is breast feeding and has dry cracked skin on nipples. She has not tried anything yet for tx. No redness, no fluctuance, no heat, no bleeding.  PERTINENT  PMH / PSH: breast feeding  OBJECTIVE:   BP 110/69    Pulse 75    Ht 5\' 4"  (1.626 m)    SpO2 99%    BMI 32.16 kg/m   Nursing note and vitals reviewed GEN: young AAW, resting comfortably in chair, NAD, class I obesity HEENT: NCAT. PERRLA. Sclera without injection or icterus. MMM. Clear oropharynx, erythematous nasal passages Neck: Supple. No LAD Cardiac: Regular rate and rhythm. Normal S1/S2. No murmurs, rubs, or gallops appreciated. 2+ radial pulses. Lungs: Clear bilaterally to ascultation. No increased WOB, no accessory muscle usage. No w/r/r. Neuro: AOx3  Ext: no edema Psych: Pleasant and appropriate   ASSESSMENT/PLAN:   URI with cough and congestion 26 yo woman presents with 6 days of viral URI sx and COVID exposure. Flu and COVID obtained. Discussed supportive care for her and infant (not our patient), return precautions. - f/u results flu/covid - zofran for nausea  Nipple dermatitis Patient is currently breast feeding and has dry skin on both nipples, no redness/fluctuance/heat. Discussed returned precautions. Recommend vaseline or cocoa butter. F/u if not improved     26, MD [facility]

## 2021-04-07 NOTE — Assessment & Plan Note (Signed)
26 yo woman presents with 6 days of viral URI sx and COVID exposure. Flu and COVID obtained. Discussed supportive care for her and infant (not our patient), return precautions. - f/u results flu/covid - zofran for nausea

## 2021-04-07 NOTE — Patient Instructions (Addendum)
It was a pleasure to see you today!  For dry skin: try vaseline or coco butter. If no improvement, or redness begins, follow up for re-evaluation as you may need a prescription if this occurs.  For cough: avoid honey in babies until after 27 year old, but this may work for you. The active ingredient in robitussin and mucinex and all cough syrups can decrease your breast milk supply, so I don't recommend it. I have given you zofran for nausea and vomiting.  I am so sorry you are ill! Current CDC guidelines recommend isolating at home for the first 5 days, then you can go out and do things, but you should wear a mask as long as you have symptoms and definitely through the first 10 days of illness. (AptDealers.si.html)  You can return to work when your symptoms are gone or much improved. - take tylenol up to 1000 mg, every 6 hours as needed  - alternate with ibuprofen 600 mg (3 tablets of 200 mg) every 6 hours as needed. You can take tylenol at 6 AM, ibuprofen at 9 AM, tylenol at noon, ibuprofen at 3PM, etc. - Stay well hydrated (drink at least 1 liter of liquid per day) - If you are having trouble breathing or persistent shortness of breath, go to an urgent care or emergency department to be evaluated in person - If you aren't getting better by this time next week, make a follow up appointment  Be Well,  Dr. Leary Roca  Your child has a viral upper respiratory tract infection. Over the counter cold and cough medications are not recommended for children younger than 109 years old.  1. Timeline for the common cold: Symptoms typically peak at 2-3 days of illness and then gradually improve over 10-14 days. However, a cough may last 2-4 weeks.   2. Please encourage your child to drink plenty of fluids.   3. You do not need to treat every fever but if your child is uncomfortable, you may give your child acetaminophen (Tylenol) every 4-6 hours if your  child is older than 3 months. If your child is older than 6 months you may give Ibuprofen (Advil or Motrin) every 6-8 hours. You may also alternate Tylenol with ibuprofen by giving one medication every 3 hours.   4. If your infant has nasal congestion, you can try saline nose drops to thin the mucus, followed by bulb suction to temporarily remove nasal secretions. You can buy saline drops at the grocery store or pharmacy or you can make saline drops at home by adding 1/2 teaspoon (2 mL) of table salt to 1 cup (8 ounces or 240 ml) of warm water  Steps for saline drops and bulb syringe STEP 1: Instill 3 drops per nostril. (Age under 1 year, use 1 drop and do one side at a time)  STEP 2: Blow (or suction) each nostril separately, while closing off the  other nostril. Then do other side.  STEP 3: Repeat nose drops and blowing (or suctioning) until the  discharge is clear.  For older children you can buy a saline nose spray at the grocery store or the pharmacy  5. Please call your doctor if your child is: Refusing to drink anything for a prolonged period Having behavior changes, including irritability or lethargy (decreased responsiveness) Having difficulty breathing, working hard to breathe, or breathing rapidly Has fever greater than 101F (38.4C) for more than three days Nasal congestion that does not improve or worsens over the  course of 14 days The eyes become red or develop yellow discharge There are signs or symptoms of an ear infection (pain, ear pulling, fussiness) Cough lasts more than 3 weeks

## 2021-04-08 LAB — SARS-COV-2, NAA 2 DAY TAT

## 2021-04-08 LAB — NOVEL CORONAVIRUS, NAA: SARS-CoV-2, NAA: DETECTED — AB

## 2021-08-26 ENCOUNTER — Encounter: Payer: Self-pay | Admitting: *Deleted

## 2022-01-19 ENCOUNTER — Ambulatory Visit (INDEPENDENT_AMBULATORY_CARE_PROVIDER_SITE_OTHER): Payer: Medicaid Other | Admitting: Family Medicine

## 2022-01-19 ENCOUNTER — Encounter: Payer: Self-pay | Admitting: Family Medicine

## 2022-01-19 VITALS — BP 100/74 | HR 90 | Ht 64.0 in | Wt 215.0 lb

## 2022-01-19 DIAGNOSIS — R1111 Vomiting without nausea: Secondary | ICD-10-CM

## 2022-01-19 MED ORDER — PANTOPRAZOLE SODIUM 40 MG PO TBEC: 40.0000 mg | DELAYED_RELEASE_TABLET | Freq: Every day | ORAL | 0 refills | Status: DC

## 2022-01-19 NOTE — Patient Instructions (Signed)
For your vomiting in the morning, I am wondering if it could be related to your reflux. We will try 2 months of a daily medication to see if that helps anything. Otherwise this could just be changes in your body since your delivery, but it is reassuring that eating in the morning helps as well.

## 2022-01-19 NOTE — Progress Notes (Signed)
    SUBJECTIVE:   CHIEF COMPLAINT / HPI:   Persistent nausea - Still will vomit in the morning if doesn't brush teeth due to the sour taste - Associated with not eating in the morning - LMP was 10/3 but has had spotting for most of the month (this is the first time this has happened)  - No other symptoms such as nausea, diarrhea, abdominal pain present  PERTINENT  PMH / PSH: Reviewed  OBJECTIVE:   BP 100/74   Pulse 90   Ht 5\' 4"  (1.626 m)   Wt 215 lb (97.5 kg)   SpO2 99%   Breastfeeding No   BMI 36.90 kg/m   General: NAD, well-appearing, well-nourished Respiratory: No respiratory distress, breathing comfortably, able to speak in full sentences Skin: warm and dry, no rashes noted on exposed skin Psych: Appropriate affect and mood  ASSESSMENT/PLAN:   Vomiting Episodes of vomiting that happened in the morning, improved with eating.  Exacerbated by taste and odors that are left in the mouth if she does not brush.  No red flag signs per history and given the intermittent symptoms, do wonder if this is possibly related to changes that occurred since pregnancy or if reflux could be a component of this. - Trial of Protonix x 2 months - Follow-up in 1 month if no improvement, or sooner if worsening  Rise Patience, DO [place]

## 2022-04-06 ENCOUNTER — Other Ambulatory Visit: Payer: Self-pay | Admitting: Family Medicine

## 2022-04-06 ENCOUNTER — Encounter: Payer: Self-pay | Admitting: Family Medicine

## 2022-04-06 ENCOUNTER — Ambulatory Visit: Payer: Medicaid Other | Admitting: Family Medicine

## 2022-04-06 VITALS — BP 98/62 | HR 72 | Wt 200.0 lb

## 2022-04-06 DIAGNOSIS — Z23 Encounter for immunization: Secondary | ICD-10-CM | POA: Diagnosis not present

## 2022-04-06 DIAGNOSIS — E282 Polycystic ovarian syndrome: Secondary | ICD-10-CM

## 2022-04-06 MED ORDER — NORGESTIMATE-ETH ESTRADIOL 0.25-35 MG-MCG PO TABS: 1.0000 | ORAL_TABLET | Freq: Every day | ORAL | 11 refills | Status: DC

## 2022-04-06 MED ORDER — METFORMIN HCL ER 500 MG PO TB24: 500.0000 mg | ORAL_TABLET | Freq: Every day | ORAL | 1 refills | Status: DC

## 2022-04-06 NOTE — Progress Notes (Addendum)
    SUBJECTIVE:   CHIEF COMPLAINT / HPI:   Contraception Management - Has pain with her nexplanon - It was placed in Oct 2022  - Has had worsening discomfort since starting new job in July 2023 (uses her arms more) - No history of DVT or migraine with aura  PCOS - Wants to restart her metformin - Is not breastfeeding  PERTINENT  PMH / PSH: Reviewed  OBJECTIVE:   BP 98/62   Pulse 72   Wt 200 lb (90.7 kg)   SpO2 92%   BMI 34.33 kg/m   General: NAD, well-appearing, well-nourished Respiratory: No respiratory distress, breathing comfortably, able to speak in full sentences Skin: warm and dry, no rashes noted on exposed skin Psych: Appropriate affect and mood  ASSESSMENT/PLAN:   Contraception management - OCPs prescribed for overlap time - 1 week follow-up for Nexplanon removal  PCOS - Metformin prescribed with titration schedule (increasing by 1 pills each week up to 1000mg  BID)    Rise Patience, DO [place]

## 2022-04-06 NOTE — Assessment & Plan Note (Signed)
Patient wanting to start Metformin. Titration schedule given to patient to start from 500mg  daily and work up to 1000mg  BID

## 2022-04-06 NOTE — Patient Instructions (Signed)
I am sending in a prescription for birth control pills that you can go ahead and start taking now so that we get ahead on coverage/protection from pregnancy

## 2022-04-13 ENCOUNTER — Ambulatory Visit: Payer: Medicaid Other | Admitting: Family Medicine

## 2022-05-07 ENCOUNTER — Emergency Department (HOSPITAL_COMMUNITY)
Admission: EM | Admit: 2022-05-07 | Discharge: 2022-05-07 | Disposition: A | Discharge status: Home / Self Care | Payer: Medicaid Other | Attending: Emergency Medicine | Admitting: Emergency Medicine

## 2022-05-07 ENCOUNTER — Other Ambulatory Visit: Payer: Self-pay

## 2022-05-07 ENCOUNTER — Encounter (HOSPITAL_COMMUNITY): Payer: Self-pay

## 2022-05-07 DIAGNOSIS — W01198A Fall on same level from slipping, tripping and stumbling with subsequent striking against other object, initial encounter: Secondary | ICD-10-CM | POA: Diagnosis not present

## 2022-05-07 DIAGNOSIS — Y92002 Bathroom of unspecified non-institutional (private) residence single-family (private) house as the place of occurrence of the external cause: Secondary | ICD-10-CM | POA: Insufficient documentation

## 2022-05-07 DIAGNOSIS — W19XXXA Unspecified fall, initial encounter: Secondary | ICD-10-CM | POA: Diagnosis not present

## 2022-05-07 DIAGNOSIS — R42 Dizziness and giddiness: Secondary | ICD-10-CM | POA: Diagnosis not present

## 2022-05-07 DIAGNOSIS — S00511A Abrasion of lip, initial encounter: Secondary | ICD-10-CM | POA: Diagnosis not present

## 2022-05-07 DIAGNOSIS — Z91040 Latex allergy status: Secondary | ICD-10-CM | POA: Diagnosis not present

## 2022-05-07 DIAGNOSIS — R55 Syncope and collapse: Secondary | ICD-10-CM | POA: Insufficient documentation

## 2022-05-07 DIAGNOSIS — I959 Hypotension, unspecified: Secondary | ICD-10-CM | POA: Diagnosis not present

## 2022-05-07 LAB — CBC
HCT: 37.8 % (ref 36.0–46.0)
Hemoglobin: 12.3 g/dL (ref 12.0–15.0)
MCH: 31.5 pg (ref 26.0–34.0)
MCHC: 32.5 g/dL (ref 30.0–36.0)
MCV: 96.7 fL (ref 80.0–100.0)
Platelets: 306 10*3/uL (ref 150–400)
RBC: 3.91 MIL/uL (ref 3.87–5.11)
RDW: 12.9 % (ref 11.5–15.5)
WBC: 5.7 10*3/uL (ref 4.0–10.5)
nRBC: 0 % (ref 0.0–0.2)

## 2022-05-07 LAB — BASIC METABOLIC PANEL
Anion gap: 8 (ref 5–15)
BUN: 10 mg/dL (ref 6–20)
CO2: 19 mmol/L — ABNORMAL LOW (ref 22–32)
Calcium: 7.6 mg/dL — ABNORMAL LOW (ref 8.9–10.3)
Chloride: 111 mmol/L (ref 98–111)
Creatinine, Ser: 0.7 mg/dL (ref 0.44–1.00)
GFR, Estimated: >60 mL/min (ref >60)
Glucose, Bld: 73 mg/dL (ref 70–99)
Potassium: 3.7 mmol/L (ref 3.5–5.1)
Sodium: 138 mmol/L (ref 135–145)

## 2022-05-07 LAB — URINALYSIS, ROUTINE W REFLEX MICROSCOPIC
Bilirubin Urine: NEGATIVE
Glucose, UA: NEGATIVE mg/dL
Hgb urine dipstick: NEGATIVE
Ketones, ur: NEGATIVE mg/dL
Leukocytes,Ua: NEGATIVE
Nitrite: NEGATIVE
Protein, ur: NEGATIVE mg/dL
Specific Gravity, Urine: 1.014 (ref 1.005–1.030)
pH: 6 (ref 5.0–8.0)

## 2022-05-07 LAB — I-STAT BETA HCG BLOOD, ED (MC, WL, AP ONLY): I-stat hCG, quantitative: <5 m[IU]/mL (ref <5)

## 2022-05-07 LAB — CBG MONITORING, ED: Glucose-Capillary: 92 mg/dL (ref 70–99)

## 2022-05-07 MED ORDER — LACTATED RINGERS IV BOLUS
1000.0000 mL | Freq: Once | INTRAVENOUS | Status: AC
  Administered 2022-05-07: 1000 mL via INTRAVENOUS

## 2022-05-07 NOTE — Discharge Instructions (Addendum)
Drink plenty of fluids Your heart tracing and labs are essentially normal.  However there is some decrease in your calcium level.  Please recheck with your doctor to have this rechecked next week.  Return if you are having any new or worsening symptoms

## 2022-05-07 NOTE — ED Notes (Signed)
Pt placed on cm sp02 and nibp call bell in reach

## 2022-05-07 NOTE — ED Notes (Signed)
Pt ambulatory to bathroom w/o assist. No complaints

## 2022-05-07 NOTE — ED Provider Notes (Signed)
[facility] Provider Note   CSN: [id number] Arrival date & time: 05/07/22  1001     History  Chief Complaint  Patient presents with   Loss of Consciousness    Linda Nunez is a 27 y.o. female.  HPI 27 year old female history of PCOS, presents today after syncopal episode.  She became nauseated and gagged but did not vomit and had a syncopal episode.  She fell forward struck her lip.  She was awake almost immediately.  She has no further complaints of the pain in her lip.  She had no loss of conscious after the fall.  She has not had similar episodes in the past she is not currently nauseated no vomiting is noted.  Menstrual cycle normal on the 11th of the month     Home Medications Prior to Admission medications   Medication Sig Start Date End Date Taking? Authorizing Provider  ibuprofen (ADVIL) 600 MG tablet Take 1 tablet (600 mg total) by mouth every 6 (six) hours. 04/07/21   Brimage, Ronnette Juniper, DO  metFORMIN (GLUCOPHAGE-XR) 500 MG 24 hr tablet TAKE 1 TABLET EVERY MORNING X 7 DAILY,1 TWICE DAILY X 7 DAILY,2 TABS EVERY MORNING AND 1 AT BEDTIME X 7 DAILY, 2 TABS TWICE DAILY 04/06/22   Lilland, Alana, DO  norgestimate-ethinyl estradiol (ORTHO-CYCLEN) 0.25-35 MG-MCG tablet Take 1 tablet by mouth daily. 04/06/22   Lilland, Alana, DO  ondansetron (ZOFRAN) 4 MG tablet Take 1 tablet (4 mg total) by mouth every 8 (eight) hours as needed for nausea or vomiting. 04/07/21   Gladys Damme, MD  pantoprazole (PROTONIX) 40 MG tablet Take 1 tablet (40 mg total) by mouth daily. 01/19/22   Lilland, Alana, DO  polyethylene glycol (MIRALAX / GLYCOLAX) 17 g packet Take 17 g by mouth daily.    [provider]  Prenatal Vit-Fe Fumarate-FA (PRENATAL VITAMIN) 27-0.8 MG TABS Take once daily 09/02/20   Lyndee Hensen, DO      Allergies    Latex    Review of Systems   Review of Systems  Physical Exam Updated Vital Signs BP 107/81   Pulse 66   Temp  98.6 F (37 C) (Oral)   Resp 15   Ht 1.626 m (5' 4"$ )   Wt 88.5 kg   SpO2 99%   BMI 33.47 kg/m  Physical Exam Vitals and nursing note reviewed.  Constitutional:      General: She is not in acute distress.    Appearance: Normal appearance. She is obese.  HENT:     Head: Normocephalic.     Right Ear: External ear normal.     Left Ear: External ear normal.     Nose: Nose normal.     Mouth/Throat:     Comments: Abrasion to lower lip Mild tenderness left mandible No swelling Teeth alignment normal per patient Eyes:     Pupils: Pupils are equal, round, and reactive to light.  Cardiovascular:     Rate and Rhythm: Normal rate and regular rhythm.  Pulmonary:     Effort: Pulmonary effort is normal.  Abdominal:     General: Abdomen is flat.     Palpations: Abdomen is soft.  Musculoskeletal:        General: Normal range of motion.     Cervical back: Normal range of motion and neck supple.  Skin:    General: Skin is warm.     Capillary Refill: Capillary refill takes less than 2 seconds.  Neurological:  General: No focal deficit present.     Mental Status: She is alert.  Psychiatric:        Mood and Affect: Mood normal.     ED Results / Procedures / Treatments   Labs (all labs ordered are listed, but only abnormal results are displayed) Labs Reviewed  BASIC METABOLIC PANEL - Abnormal; Notable for the following components:      Result Value   CO2 19 (*)    Calcium 7.6 (*)    All other components within normal limits  CBC  URINALYSIS, ROUTINE W REFLEX MICROSCOPIC  CBG MONITORING, ED  I-STAT BETA HCG BLOOD, ED ([facility], [facility], [facility] ONLY)    EKG EKG Interpretation  Date/Time:  Thursday May 07 2022 10:17:23 EST Ventricular Rate:  69 PR Interval:  147 QRS Duration: 90 QT Interval:  380 QTC Calculation: 408 R Axis:   68 Text Interpretation: Sinus rhythm Confirmed by Pattricia Boss [phone]) on 05/07/2022 11:10:10 AM  Radiology No results found.  Procedures Procedures     Medications Ordered in ED Medications  lactated ringers bolus 1,000 mL (0 mLs Intravenous Stopped 05/07/22 1332)    ED Course/ Medical Decision Making/ A&P                             Medical Decision Making Amount and/or Complexity of Data Reviewed Labs: ordered.   27 year old female with nausea, gagging, and syncope. Differential diagnosis includes but is not limited to arrhythmia, anemia, metabolic abnormality, hypotension due to other causes. She is evaluated here in the ED with pregnancy test, urinalysis, metabolic panel, CBC Sodium potassium chloride BUN/creatinine within normal limits CO2 is decreased at 19 and calcium of 7.6. No definite cause of hypocalcemia noted on patient's history and will have patient follow-up and recheck as outpatient CBC was reviewed and interpreted and is normal. EKG without acute abnormalities Will give a liter of fluid, check orthostatics, and reevaluate. Patient exam and history most consistent with vasovagal episode.         Final Clinical Impression(s) / ED Diagnoses Final diagnoses:  Syncope and collapse  Vasovagal episode    Rx / DC Orders ED Discharge Orders     None         Pattricia Boss, MD 05/09/22 1529

## 2022-05-07 NOTE — ED Triage Notes (Signed)
From home, witnessed syncopal episode after using the bathroom, hit her head and lip on the bathroom sink. Down for approx 30 seconds. Initial BP 80/60s, medics gave 400cc fluids.

## 2022-05-11 ENCOUNTER — Inpatient Hospital Stay: Payer: Medicaid Other

## 2022-05-12 ENCOUNTER — Ambulatory Visit: Payer: Medicaid Other | Admitting: Family Medicine

## 2022-05-12 VITALS — BP 108/72 | HR 76 | Ht 64.0 in | Wt 202.1 lb

## 2022-05-12 DIAGNOSIS — Z09 Encounter for follow-up examination after completed treatment for conditions other than malignant neoplasm: Secondary | ICD-10-CM

## 2022-05-12 NOTE — Progress Notes (Signed)
    SUBJECTIVE:   CHIEF COMPLAINT / HPI:   Patient presents for ED follow up after having a syncopal episode about 5 days. She was standing in the bathroom and walked out since she started to get hot, her body felt like funny. She felt nauseous and tried to vomit but nothing came out, bending over the toilet. As she was standing back up, she fell. Her boyfriend was present, she believes she loss consciousness for about 30 seconds. She was not told that she had jerking movements, tongue biting and urinary incontinence. Just prior to this, she had not been eating much and knows that she is not staying hydrated. Since she has been eating more and staying hydrated. Has been drinking a significantly more amount of water than a few days ago. She has never experienced an episode like this before. Denies any personal or family history of seizure disorder. Denies any head trauma or injury.   OBJECTIVE:   BP 108/72   Pulse 76   Ht 5' 4"$  (1.626 m)   Wt 202 lb 2 oz (91.7 kg)   LMP 04/28/2022   SpO2 96%   BMI 34.69 kg/m   General: Patient well-appearing, in no acute distress. HEENT: no pain with extraoccular movements  CV: RRR, no murmurs or gallops auscultated Resp: CTAB, no wheezing, rales or rhonchi noted Neuro: CN 2-12 grossly intact, 5/5 UE and LE strength bilaterally, gross sensation intact, negative Romberg's testing, normal gait   ASSESSMENT/PLAN:   Follow-up examination -reassuringly patient's symptoms have resolved and she is back to baseline, discussed importance of staying hydrated by drinking at least 6-8 glasses of water a day and eating at least 3 meals a day  -orthostatics negative today, likely secondary to vasovagal episode from dehydration. Other differentials considered but low suspicion given history and lab findings included seizure disorder, anemia, hyponatremia, hypoglycemia and pregnancy -strict ED precautions provided  -follow up as appropriate  -PHQ-9 score of 0  reviewed.    Donney Dice, [place]

## 2022-05-12 NOTE — Assessment & Plan Note (Signed)
-  reassuringly patient's symptoms have resolved and she is back to baseline, discussed importance of staying hydrated by drinking at least 6-8 glasses of water a day and eating at least 3 meals a day  -orthostatics negative today, likely secondary to vasovagal episode from dehydration. Other differentials considered but low suspicion given history and lab findings included seizure disorder, anemia, hyponatremia, hypoglycemia and pregnancy -strict ED precautions provided  -follow up as appropriate

## 2022-05-12 NOTE — Patient Instructions (Addendum)
It was great seeing you today!  Today we discussed your recent visit to the emergency department, I am so glad that you are better! Please make sure to eat at least 3 meals a day and drink at least 6-8 glasses of water a day. All your blood work from the hospital looked fine!  If this occurs again, then please go to the emergency department.   Please follow up at your next scheduled appointment, if anything arises between now and then, please don't hesitate to contact our office.   Thank you for allowing [place] to be a part of your medical care!  Thank you, Dr. Larae Grooms  Also a reminder of our clinic's no-show policy. Please make sure to arrive at least 15 minutes prior to your scheduled appointment time. Please try to cancel before 24 hours if you are not able to make it. If you no-show for 2 appointments then you will be receiving a warning letter. If you no-show after 3 visits, then you may be at risk of being dismissed from our clinic. This is to ensure that everyone is able to be seen in a timely manner. Thank you, we appreciate your assistance with this!

## 2022-06-01 ENCOUNTER — Ambulatory Visit: Payer: Self-pay | Admitting: Student

## 2022-06-01 NOTE — Progress Notes (Deleted)
  SUBJECTIVE:   CHIEF COMPLAINT / HPI:   Nexplanon removal:  Experiencing pain with Nexplanon in the arm.  Placed 12/2020  Would like removal  Denies fever, chills, systemic symptoms  LMP   PERTINENT  PMH / PSH:   Past Medical History:  Diagnosis Date   Chlamydia    History of PCOS    PCOS (polycystic ovarian syndrome)     Patient Care Team: Rise Patience, DO as PCP - General (Family Medicine) OBJECTIVE:  LMP 04/28/2022  Physical Exam  General: NAD, pleasant, able to participate in exam Respiratory: No respiratory distress Skin: warm and dry, no rashes noted Psych: Normal affect and mood    Nexplanon Removal:   Patient given informed consent for removal of her Implanon, time out was performed.  Signed copy in the chart.  Appropriate time out taken. Implanon site identified.  Area prepped in usual sterile fashon. One cc of 1% lidocaine was used to anesthetize the area at the distal end of the implant. A small stab incision was made right beside the implant on the distal portion.  The implanon rod was grasped using hemostats and removed without difficulty.  There was less than 3 cc blood loss. There were no complications.  A small amount of antibiotic ointment and steri-strips were applied over the small incision.  A pressure bandage was applied to reduce any bruising.  The patient tolerated the procedure well and was given post procedure instructions.   ASSESSMENT/PLAN:  There are no diagnoses linked to this encounter.    No follow-ups on file. Linda Emery, MD 06/01/2022, 12:01 PM PGY-2, Linda Nunez {    This will disappear when note is signed, click to select method of visit    :1}

## 2022-07-29 ENCOUNTER — Encounter (HOSPITAL_COMMUNITY): Payer: Self-pay | Admitting: Emergency Medicine

## 2022-07-29 ENCOUNTER — Other Ambulatory Visit: Payer: Self-pay

## 2022-07-29 ENCOUNTER — Emergency Department (HOSPITAL_COMMUNITY)
Admission: EM | Admit: 2022-07-29 | Discharge: 2022-07-30 | Disposition: A | Discharge status: Home / Self Care | Payer: No Typology Code available for payment source | Attending: Emergency Medicine | Admitting: Emergency Medicine

## 2022-07-29 DIAGNOSIS — Y92410 Unspecified street and highway as the place of occurrence of the external cause: Secondary | ICD-10-CM | POA: Diagnosis not present

## 2022-07-29 DIAGNOSIS — Z91040 Latex allergy status: Secondary | ICD-10-CM | POA: Insufficient documentation

## 2022-07-29 DIAGNOSIS — R519 Headache, unspecified: Secondary | ICD-10-CM | POA: Insufficient documentation

## 2022-07-29 DIAGNOSIS — M25551 Pain in right hip: Secondary | ICD-10-CM | POA: Diagnosis not present
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
## 2022-07-30 MED ORDER — NAPROXEN 500 MG PO TABS: 500.0000 mg | ORAL_TABLET | Freq: Two times a day (BID) | ORAL | 0 refills | Status: DC

## 2022-07-30 MED ORDER — METHOCARBAMOL 500 MG PO TABS: 500.0000 mg | ORAL_TABLET | Freq: Two times a day (BID) | ORAL | 0 refills | Status: DC

## 2022-07-30 NOTE — Discharge Instructions (Signed)
Take the prescribed medication as directed.  May wish to use heating pad/warm compress as this can help alleviate muscle soreness/tension as well. Follow-up with your primary care doctor. Return to the ED for new or worsening symptoms.
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
## 2022-08-03 ENCOUNTER — Ambulatory Visit: Payer: Medicaid Other
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
## 2022-12-24 ENCOUNTER — Encounter: Payer: Self-pay | Admitting: Student

## 2023-01-14 IMAGING — US US OB COMP LESS 14 WK
1 series · 15 of 28 positions shown · non-contrast
Comparison: None

CLINICAL DATA: Pregnant, missed menstrual period, patient states
LMP is 04/12/2020, history of irregular menses due to PCOS

EXAM:
OBSTETRIC <14 WK ULTRASOUND
TECHNIQUE: Transabdominal ultrasound was performed for evaluation of the
gestation as well as the maternal uterus and adnexal regions.

[Series 1: us ob comp less 14 wk · 61 acquisitions, 15 frames shown]
[im 1/61]
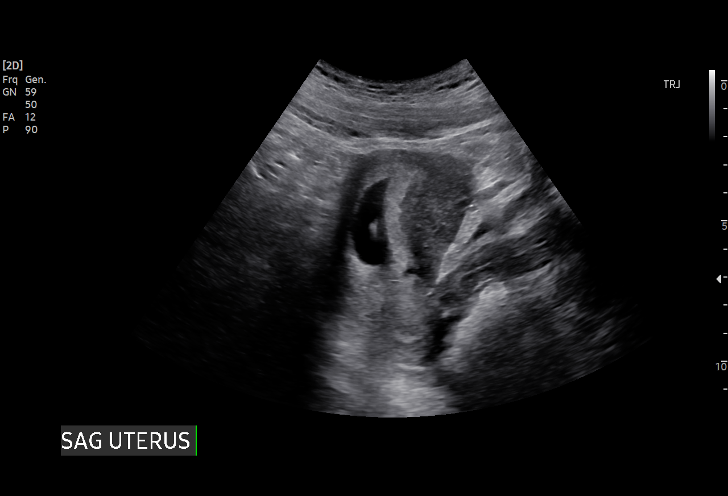
[im 5/61]
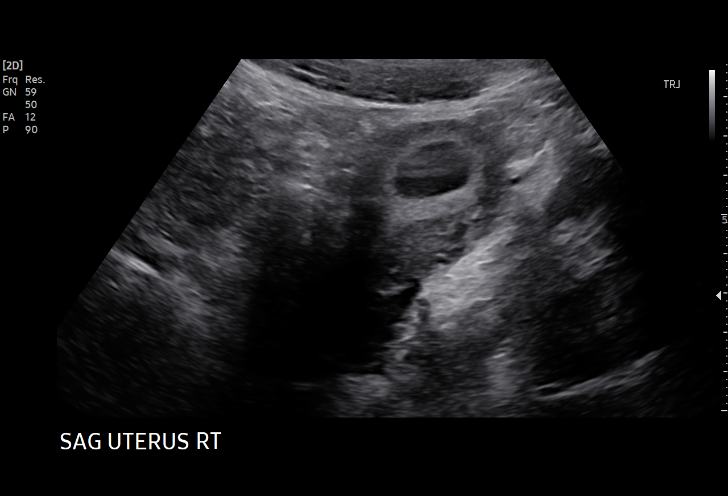
[im 9/61]
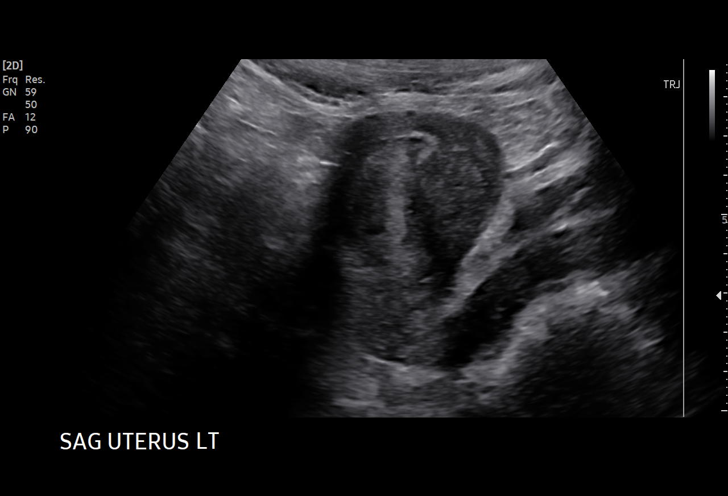
[im 14/61]
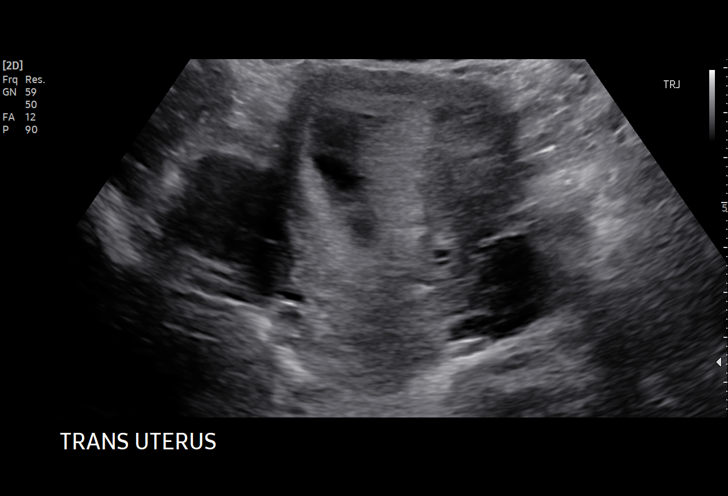
[im 18/61]
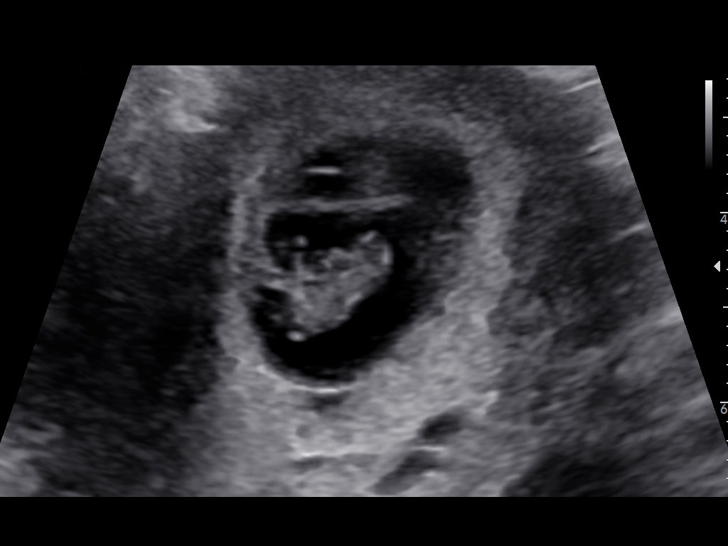
[im 23/61]
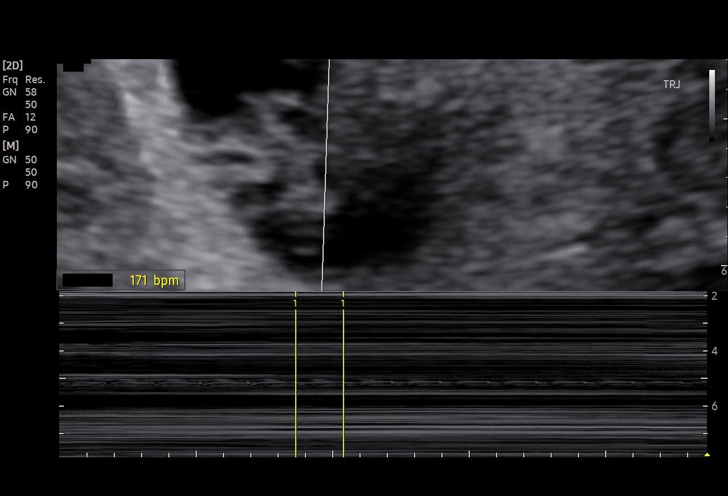
[im 27/61]
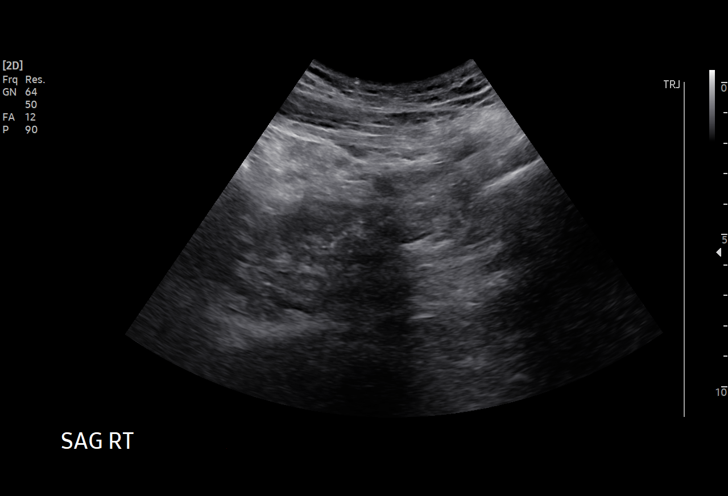
[im 32/61]
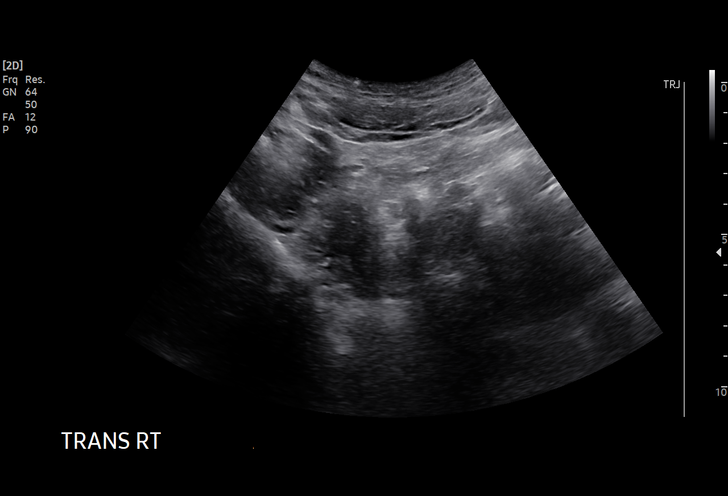
[im 34/61]
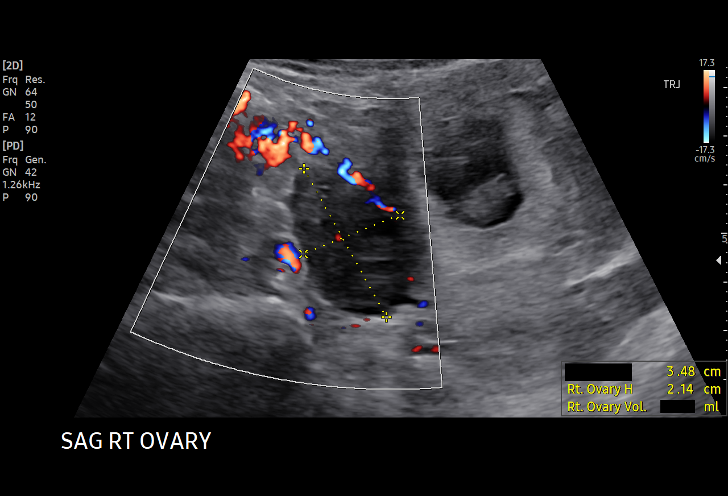
[im 38/61]
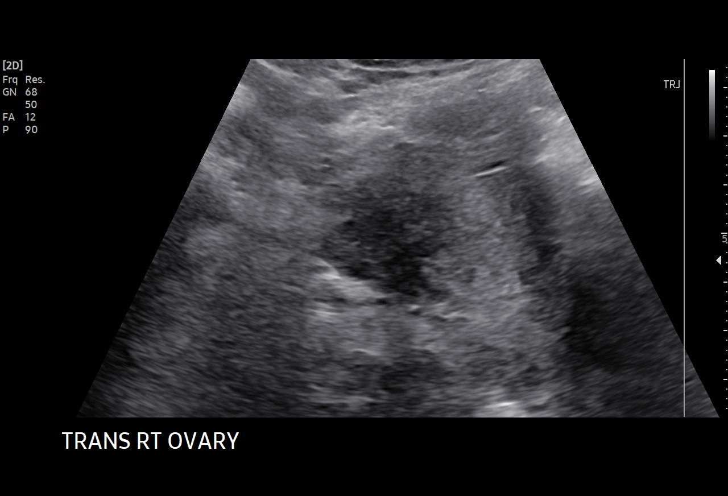
[im 43/61]
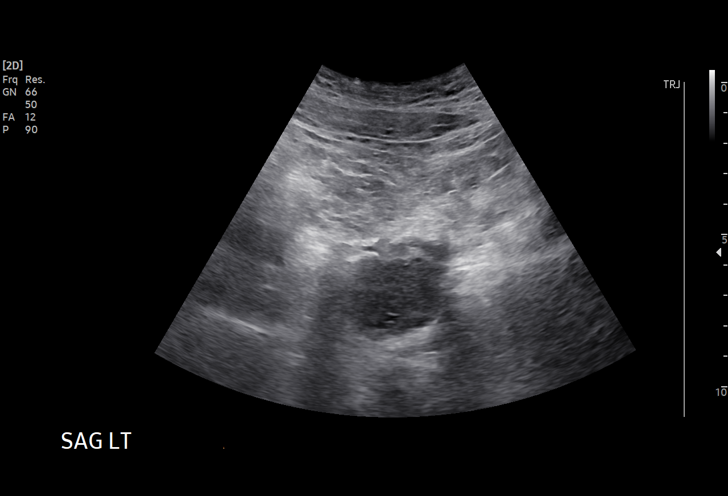
[im 47/61]
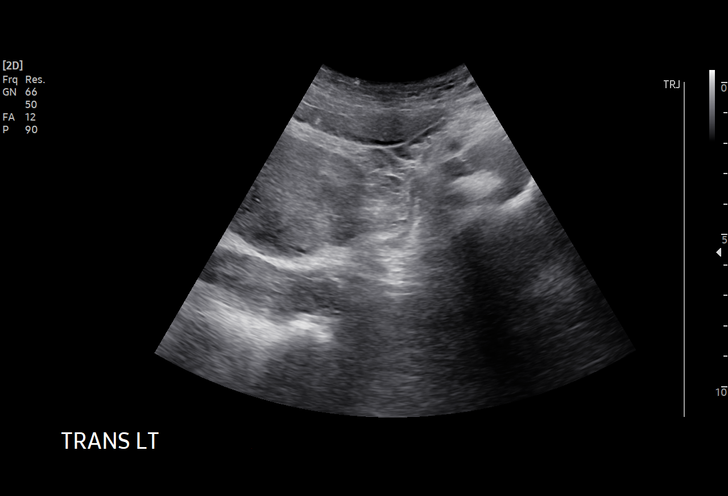
[im 52/61]
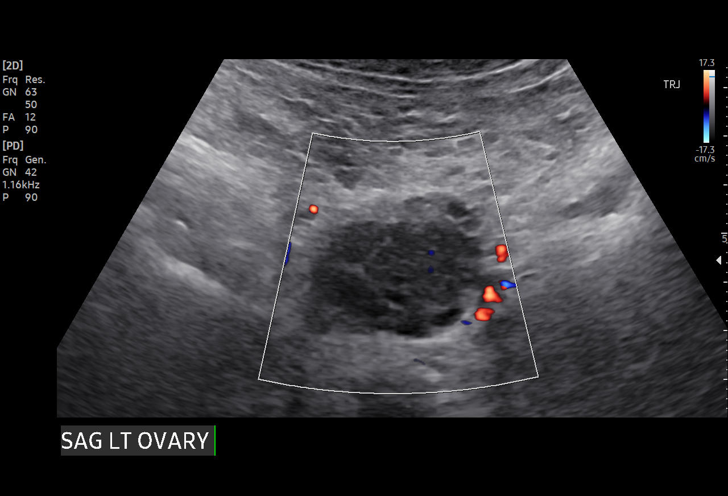
[im 56/61]
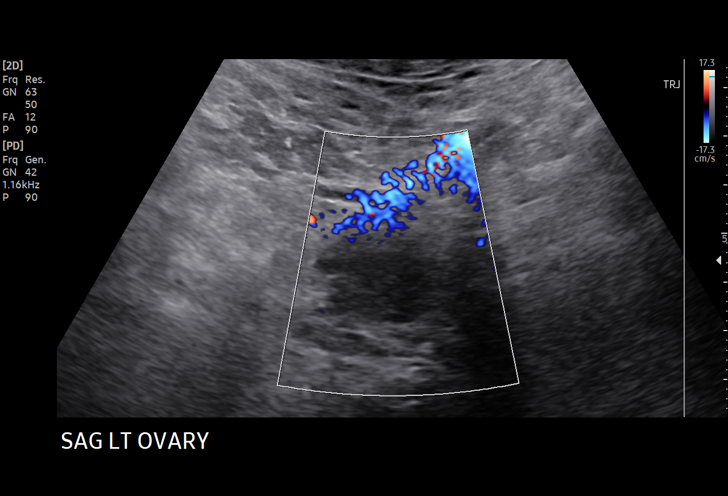
[im 61/61]
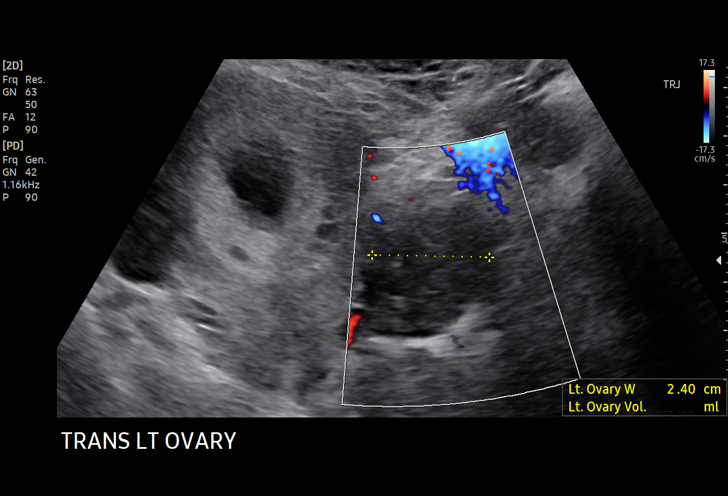

[15 of 28 positions shown; findings below may reference images not displayed]

FINDINGS: Intrauterine gestational sac: Present, single

Yolk sac:  Present

Embryo:  Present

Cardiac Activity: Present

Heart Rate: 171 bpm

CRL:   16.9 mm   8 w 1 d                  US EDC: 01/15/2021

Subchorionic hemorrhage:  None visualized.

Maternal uterus/adnexae:

Uterus anteverted, otherwise normal appearance.

No definite uterine mass seen.

RIGHT ovary measures 3.5 x 2.1 x 2.8 cm, unremarkable.

LEFT ovary measures 3.5 x 2.5 x 2.4 cm, unremarkable.

No free pelvic fluid or adnexal masses.
IMPRESSION: Single live intrauterine gestation at 8 weeks 1 day EGA by
crown-rump length.

## 2023-01-16 ENCOUNTER — Encounter (HOSPITAL_COMMUNITY): Payer: Self-pay | Admitting: *Deleted

## 2023-01-16 ENCOUNTER — Other Ambulatory Visit: Payer: Self-pay

## 2023-01-16 ENCOUNTER — Ambulatory Visit (HOSPITAL_COMMUNITY)
Admission: EM | Admit: 2023-01-16 | Discharge: 2023-01-16 | Disposition: A | Discharge status: Home / Self Care | Payer: Medicaid Other | Attending: Emergency Medicine | Admitting: Emergency Medicine

## 2023-01-16 DIAGNOSIS — S61214A Laceration without foreign body of right ring finger without damage to nail, initial encounter: Secondary | ICD-10-CM | POA: Diagnosis not present

## 2023-01-16 MED ORDER — LIDOCAINE HCL (PF) 1 % IJ SOLN
INTRAMUSCULAR | Status: AC
  Filled 2023-01-16: qty 30

## 2023-01-16 NOTE — ED Provider Notes (Signed)
[facility]    CSN: [id number] Arrival date & time: 01/16/23  1503      History   Chief Complaint Chief Complaint  Patient presents with   Laceration    HPI Enez D Reichwein is a 27 y.o. female.   Patient presents to clinic for complaints of a laceration to her right ring finger that happened today.  She cut her finger on a new keychain.  She applied pressure and came to clinic.  Denies any numbness or tingling.  Bleeding controlled with pressure gauze dressing.  Reports her Tdap was updated recently with her pregnancy, within the last 5 years.  The history is provided by the patient and medical records.  Laceration   Past Medical History:  Diagnosis Date   Chlamydia    History of PCOS    PCOS (polycystic ovarian syndrome)     Patient Active Problem List   Diagnosis Date Noted   Nipple dermatitis 04/07/2021   History of sexually transmitted disease 09/26/2020   PCOS (polycystic ovarian syndrome) 08/25/2019    Past Surgical History:  Procedure Laterality Date   NO PAST SURGERIES      OB History     Gravida  1   Para  1   Term  1   Preterm      AB      Living  1      SAB      IAB      Ectopic      Multiple  0   Live Births  1            Home Medications    Prior to Admission medications   Medication Sig Start Date End Date Taking? Authorizing Provider  ibuprofen (ADVIL) 600 MG tablet Take 1 tablet (600 mg total) by mouth every 6 (six) hours. 04/07/21   Brimage, Seward Meth, DO  metFORMIN (GLUCOPHAGE-XR) 500 MG 24 hr tablet TAKE 1 TABLET EVERY MORNING X 7 DAILY,1 TWICE DAILY X 7 DAILY,2 TABS EVERY MORNING AND 1 AT BEDTIME X 7 DAILY, 2 TABS TWICE DAILY 04/06/22   Lilland, Alana, DO  methocarbamol (ROBAXIN) 500 MG tablet Take 1 tablet (500 mg total) by mouth 2 (two) times daily. 07/30/22   Garlon Hatchet, PA-C  naproxen (NAPROSYN) 500 MG tablet Take 1 tablet (500 mg total) by mouth 2 (two) times daily. 07/30/22   Garlon Hatchet, PA-C   norgestimate-ethinyl estradiol (ORTHO-CYCLEN) 0.25-35 MG-MCG tablet Take 1 tablet by mouth daily. 04/06/22   Lilland, Alana, DO  ondansetron (ZOFRAN) 4 MG tablet Take 1 tablet (4 mg total) by mouth every 8 (eight) hours as needed for nausea or vomiting. 04/07/21   Shirlean Mylar, MD  pantoprazole (PROTONIX) 40 MG tablet Take 1 tablet (40 mg total) by mouth daily. 01/19/22   Lilland, Alana, DO  polyethylene glycol (MIRALAX / GLYCOLAX) 17 g packet Take 17 g by mouth daily.    [provider]  Prenatal Vit-Fe Fumarate-FA (PRENATAL VITAMIN) 27-0.8 MG TABS Take once daily 09/02/20   Katha Cabal, DO    Family History Family History  Problem Relation Age of Onset   Asthma Mother    Diabetes Father     Social History Social History   Tobacco Use   Smoking status: Former    Types: Cigarettes    Passive exposure: Past   Smokeless tobacco: Never  Vaping Use   Vaping status: Former   Substances: THC, CBD  Substance Use Topics   Alcohol use:  Yes    Comment: social    Drug use: Yes    Frequency: 7.0 times per week    Types: Marijuana    Comment: 05/28/2020     Allergies   Latex   Review of Systems Review of Systems   Physical Exam Triage Vital Signs ED Triage Vitals  Encounter Vitals Group     BP 01/16/23 1541 102/68     Systolic BP Percentile --      Diastolic BP Percentile --      Pulse Rate 01/16/23 1541 80     Resp 01/16/23 1541 18     Temp 01/16/23 1541 98.3 F (36.8 C)     Temp src --      SpO2 01/16/23 1541 98 %     Weight --      Height --      Head Circumference --      Peak Flow --      Pain Score 01/16/23 1538 0     Pain Loc --      Pain Education --      Exclude from Growth Chart --    No data found.  Updated Vital Signs BP 102/68   Pulse 80   Temp 98.3 F (36.8 C)   Resp 18   LMP 01/09/2023   SpO2 98%   Visual Acuity Right Eye Distance:   Left Eye Distance:   Bilateral Distance:    Right Eye Near:   Left Eye Near:     Bilateral Near:     Physical Exam Vitals and nursing note reviewed.  Constitutional:      Appearance: Normal appearance.  HENT:     Head: Normocephalic and atraumatic.     Right Ear: External ear normal.     Left Ear: External ear normal.     Nose: Nose normal.     Mouth/Throat:     Mouth: Mucous membranes are moist.  Eyes:     Conjunctiva/sclera: Conjunctivae normal.  Cardiovascular:     Rate and Rhythm: Normal rate.     Pulses: Normal pulses.  Pulmonary:     Effort: Pulmonary effort is normal. No respiratory distress.  Musculoskeletal:        General: Normal range of motion.  Skin:    General: Skin is warm and dry.     Findings: Laceration present.  Neurological:     General: No focal deficit present.     Mental Status: She is alert and oriented to person, place, and time.  Psychiatric:        Mood and Affect: Mood normal.        Behavior: Behavior normal. Behavior is cooperative.      UC Treatments / Results  Labs (all labs ordered are listed, but only abnormal results are displayed) Labs Reviewed - No data to display  EKG   Radiology No results found.  Procedures Laceration Repair  Date/Time: 01/16/2023 4:33 PM  Performed by: Zolton Dowson, [place] N, FNP Authorized by: Fronnie Urton, [place] N, FNP   Consent:    Consent obtained:  Verbal   Consent given by:  Patient   Risks discussed:  Infection, need for additional repair, pain, poor cosmetic result and poor wound healing   Alternatives discussed:  No treatment and delayed treatment Universal protocol:    Procedure explained and questions answered to patient or proxy's satisfaction: yes     Patient identity confirmed:  Verbally with patient Anesthesia:    Anesthesia method:  Local infiltration  Local anesthetic:  Lidocaine 1% w/o epi Laceration details:    Location:  Finger   Finger location:  R ring finger   Length (cm):  3   Depth (mm):  3 Pre-procedure details:    Preparation:  Patient was  prepped and draped in usual sterile fashion Exploration:    Hemostasis achieved with:  Direct pressure   Imaging outcome: foreign body not noted     Wound exploration: wound explored through full range of motion     Contaminated: no   Treatment:    Area cleansed with:  Saline   Amount of cleaning:  Standard   Irrigation solution:  Sterile saline   Irrigation volume:  30cc   Irrigation method:  Syringe Skin repair:    Repair method:  Sutures   Suture size:  5-0   Suture material:  Nylon   Suture technique:  Simple interrupted   Number of sutures:  3 Approximation:    Approximation:  Close Repair type:    Repair type:  Simple Post-procedure details:    Dressing:  Non-adherent dressing   Procedure completion:  Tolerated well, no immediate complications  (including critical care time)  Medications Ordered in UC Medications - No data to display  Initial Impression / Assessment and Plan / UC Course  I have reviewed the triage vital signs and the nursing notes.  Pertinent labs & imaging results that were available during my care of the patient were reviewed by me and considered in my medical decision making (see chart for details).  Vitals and triage reviewed, patient is hemodynamically stable.  Wound numbed and cleaned, three 5-0 sutures placed, edges well-approximated and patient tolerated well.  See procedure note for further details.  Tdap does not need to be updated at this time.  Deferred antibiotic due to clean wound and irrigation.  Pain management discussed with Tylenol and ibuprofen.  Topical antibiotics encouraged.  Return to clinic in 10 to 15 days for suture removal, sooner if infection develops.  Plan of care, follow-up care return precautions given, no questions at this time.     Final Clinical Impressions(s) / UC Diagnoses   Final diagnoses:  Laceration of right ring finger without foreign body without damage to nail, initial encounter     Discharge Instructions       I have placed 3 sutures in your finger.  Keep the area clean and dry for the next 24 hours.  You can apply a topical antibacterial ointment like Neosporin.  After 24 hours you can clean with warm water and antibacterial soap like Dial.  Please return to clinic sooner if you develop any signs or symptoms concerning for infection like swelling, drainage, fever, streaking or redness.  Return to clinic in the next 10 to 14 days for suture removal.    ED Prescriptions   None    PDMP not reviewed this encounter.   Zoriah Pulice, [place] N, [place] 01/16/23 [phone]

## 2023-01-16 NOTE — Discharge Instructions (Addendum)
I have placed 3 sutures in your finger.  Keep the area clean and dry for the next 24 hours.  You can apply a topical antibacterial ointment like Neosporin.  After 24 hours you can clean with warm water and antibacterial soap like Dial.  Please return to clinic sooner if you develop any signs or symptoms concerning for infection like swelling, drainage, fever, streaking or redness.  Return to clinic in the next 10 to 14 days for suture removal.

## 2023-01-16 NOTE — ED Triage Notes (Signed)
Pt reports a lac to Rt  ring finger that happened today. Pt cut finger on a key chain.

## 2023-05-07 IMAGING — US US MFM OB FOLLOW-UP
1 series · 14 of 28 positions shown · non-contrast
Comparison: none

[Series 1: us mfm ob follow-up · 40 acquisitions, 14 frames shown]
[im 2/40]
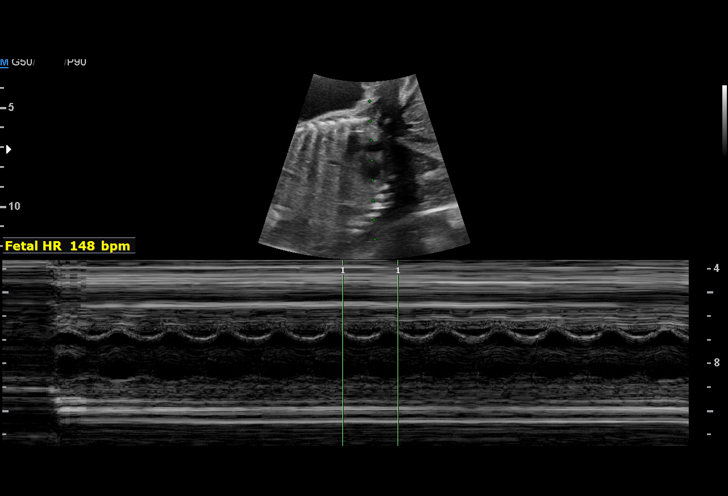
[im 5/40]
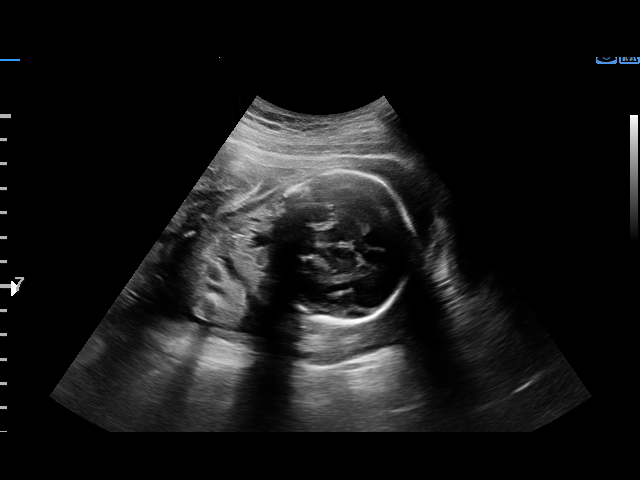
[im 8/40]
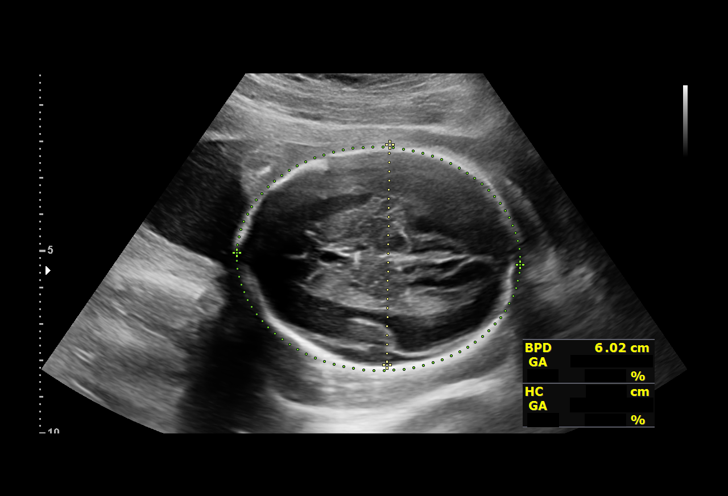
[im 11/40]
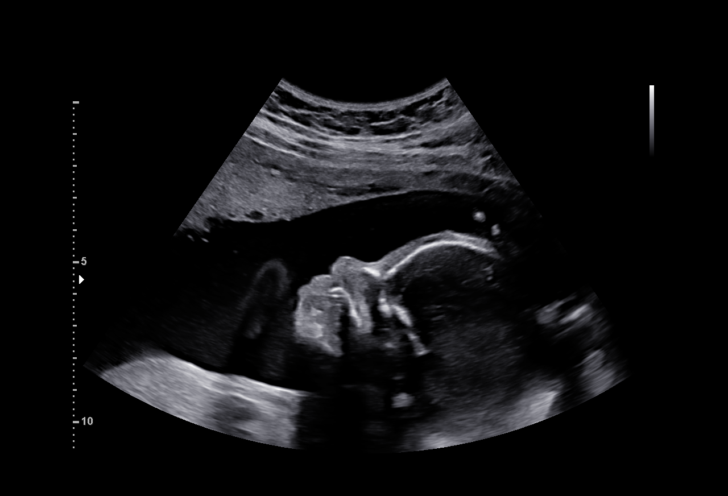
[im 14/40]
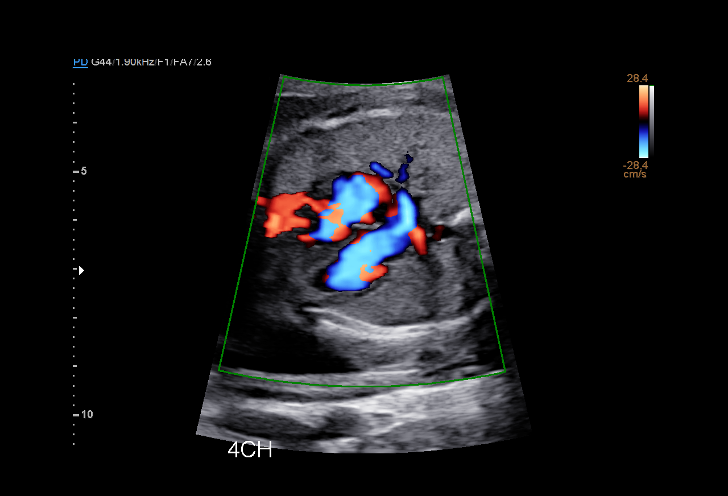
[im 16/40]
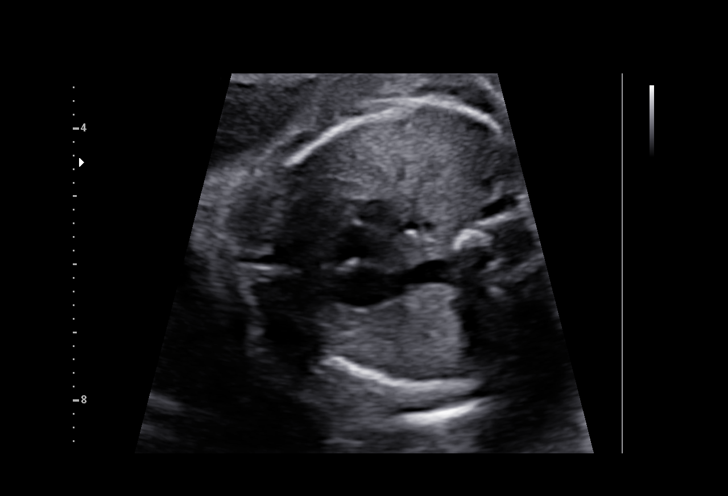
[im 19/40]
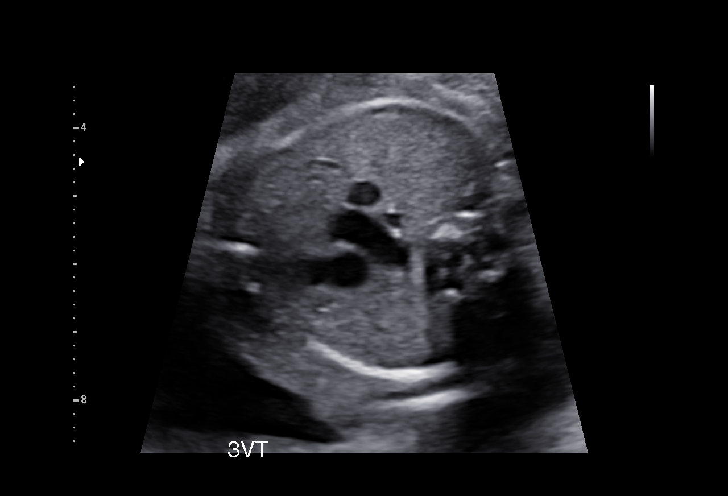
[im 22/40]
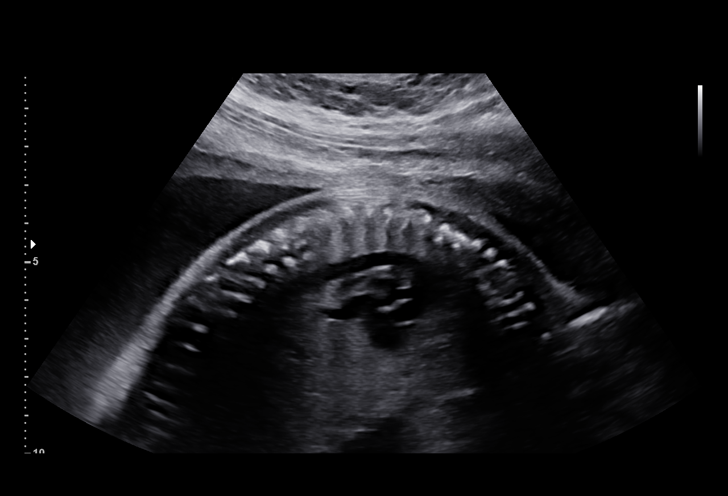
[im 25/40]
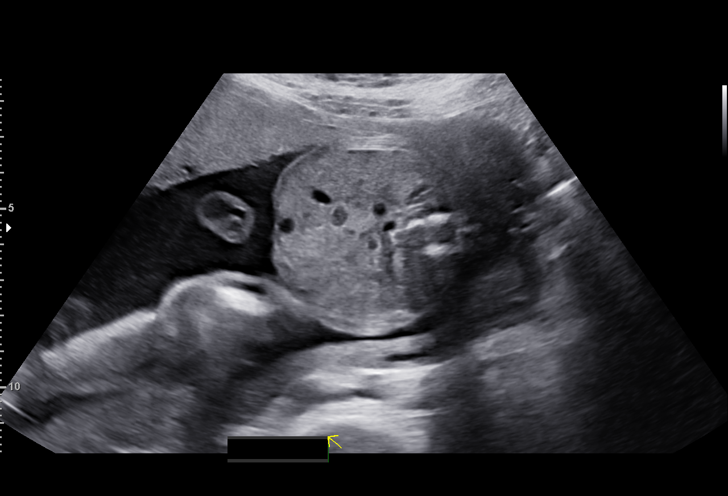
[im 28/40]
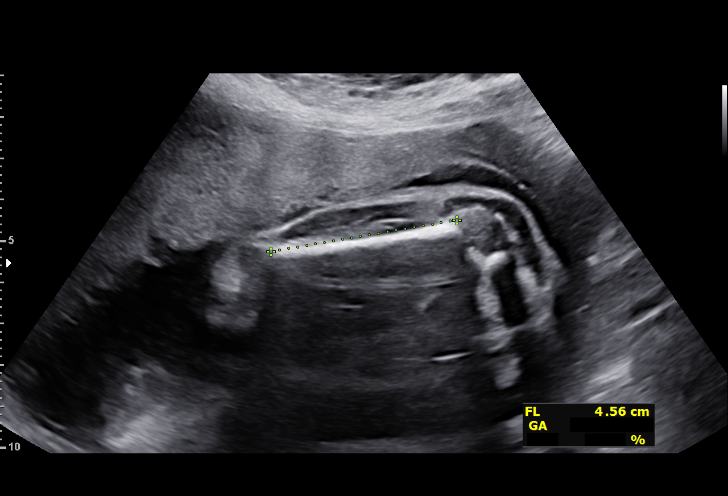
[im 31/40]
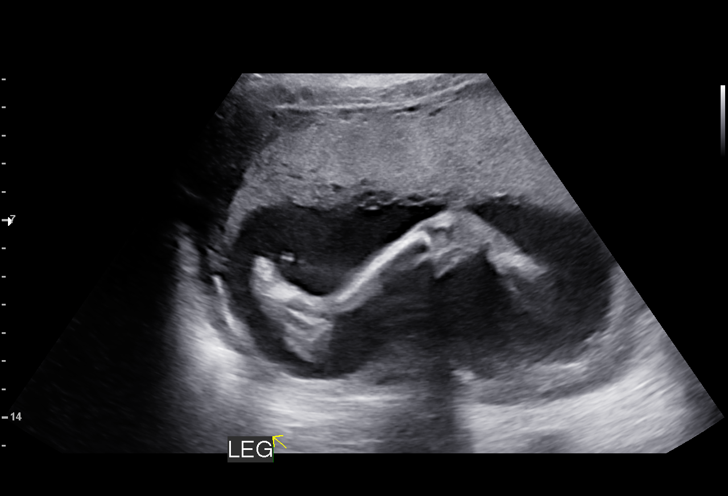
[im 34/40]
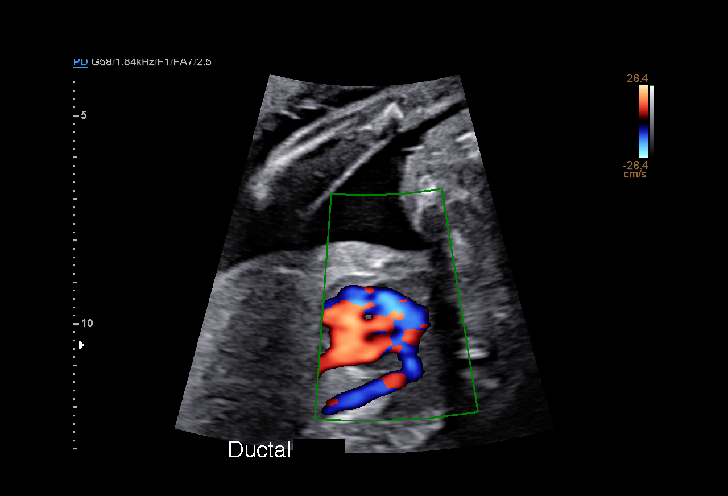
[im 37/40]
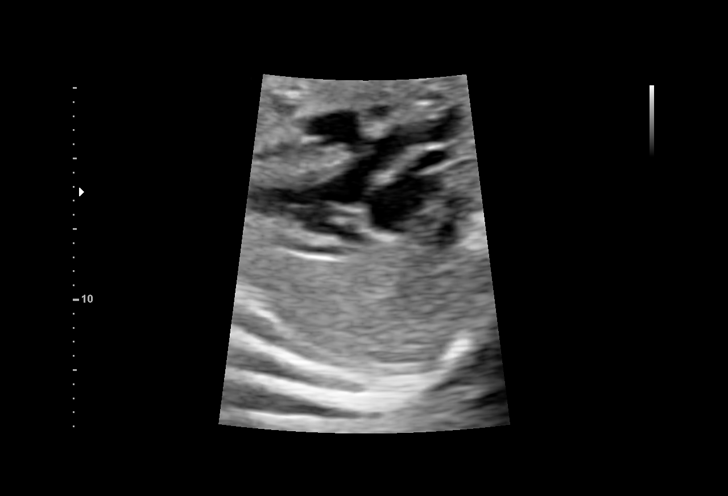
[im 40/40]
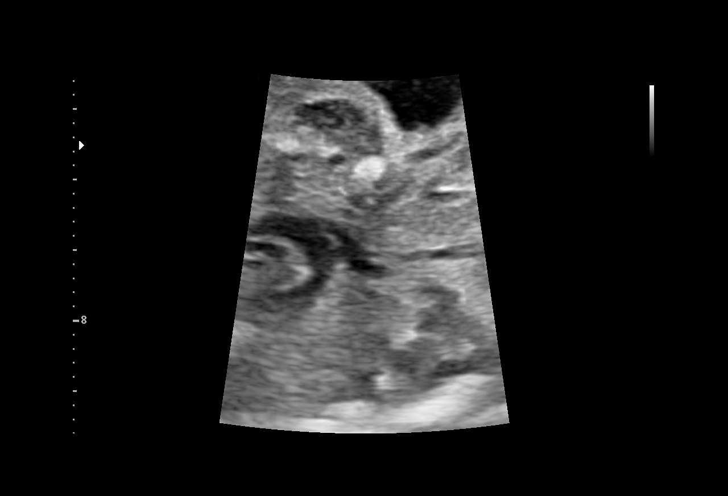

[14 of 28 positions shown; findings below may reference images not displayed]

[REDACTED]

                                                      ASHELY

Indications

 24 weeks gestation of pregnancy
 Encounter for antenatal screening for
 malformations (LR NIPS)
Fetal Evaluation

 Num Of Fetuses:         1
 Fetal Heart Rate(bpm):  148
 Cardiac Activity:       Observed
 Presentation:           Cephalic
 Placenta:               Anterior
 P. Cord Insertion:      Previously Visualized

 Amniotic Fluid
 AFI FV:      Within normal limits

                             Largest Pocket(cm)

Biometry

 BPD:      60.1  mm     G. Age:  24w 4d         51  %    CI:         75.4   %    70 - 86
                                                         FL/HC:      20.4   %    18.7 -
 HC:      219.5  mm     G. Age:  24w 0d         21  %    HC/AC:      1.10        1.05 -
 AC:      199.2  mm     G. Age:  24w 4d         50  %    FL/BPD:     74.4   %    71 - 87
 FL:       44.7  mm     G. Age:  24w 5d         52  %    FL/AC:      22.4   %    20 - 24

 LV:        4.4  mm
 Est. FW:     711  gm      1 lb 9 oz     54  %
OB History

 Gravidity:    1
 Living:       0
Gestational Age

 LMP:           25w 4d        Date:  04/01/20                 EDD:   01/06/21
 U/S Today:     24w 3d                                        EDD:   01/14/21
 Best:          24w 2d     Det. By:  Early Ultrasound         EDD:   01/15/21
                                     (06/06/20)
Anatomy

 Cranium:               Appears normal         Aortic Arch:            Previously seen
 Cavum:                 Previously seen        Ductal Arch:            Appears normal
 Ventricles:            Appears normal         Diaphragm:              Previously seen
 Choroid Plexus:        Previously seen        Stomach:                Appears normal, left
                                                                       sided
 Cerebellum:            Previously seen        Abdomen:                Previously seen
 Posterior Fossa:       Previously seen        Abdominal Wall:         Previously seen
 Nuchal Fold:           Not applicable (>20    Cord Vessels:           Previously seen
                        wks GA)
 Face:                  Profile appears        Kidneys:                Appear normal
                        normal
 Lips:                  Previously seen        Bladder:                Appears normal
 Thoracic:              Previously seen        Spine:                  Previously seen
 Heart:                 Appears normal         Upper Extremities:      Previously seen
                        (4CH, axis, and
                        situs)
 RVOT:                  Appears normal         Lower Extremities:      Previously seen
 LVOT:                  Appears normal

 Other:  Heels/feet and open hands/5th digits prev visualized. Nasal bone and
         lenses prev visualized. Female gender previously seen. VC, 3VV and
         3VTV visualized.
Cervix Uterus Adnexa

 Cervix
 Length:           4.63  cm.
 Normal appearance by transabdominal scan.

 Right Ovary
 Visualized.

 Left Ovary
 Visualized.
Impression

 Patient returned for completion of fetal anatomy .Amniotic
 fluid is normal and good fetal activity is seen .Fetal biometry
 is consistent with her previously-established dates .Fetal
 anatomical survey was completed and appears normal.
Recommendations

 Follow-up scans as clinically indicated.
                 Lister, [place]

## 2023-08-31 ENCOUNTER — Encounter: Payer: Self-pay | Admitting: *Deleted

## 2024-02-01 ENCOUNTER — Encounter: Payer: Self-pay | Admitting: Student

## 2024-02-01 ENCOUNTER — Ambulatory Visit: Admitting: Student

## 2024-02-01 VITALS — BP 126/80 | HR 68 | Wt 205.0 lb

## 2024-02-01 DIAGNOSIS — Z3009 Encounter for other general counseling and advice on contraception: Secondary | ICD-10-CM

## 2024-02-01 DIAGNOSIS — Z3046 Encounter for surveillance of implantable subdermal contraceptive: Secondary | ICD-10-CM

## 2024-02-01 MED ORDER — NORGESTIMATE-ETH ESTRADIOL 0.25-35 MG-MCG PO TABS: 1.0000 | ORAL_TABLET | Freq: Every day | ORAL | 11 refills | Status: AC

## 2024-02-01 NOTE — Patient Instructions (Signed)
 You had a Nexplanon  removed. Following this new insertion you should continue wearing your pressure bandage for 24 hours.   Keep the adhesive bandage on for 3-5 days until they fall off. It is normal to have some swelling and bruising around the site. If you notice severe swelling, fever >100.4 or drainage from the area, please come back in to be evaluated.    I have sent Oral birth control pills to your pharmacy. If you would like to have an IUD placed please call and make an appointment with me

## 2024-02-01 NOTE — Progress Notes (Unsigned)
    SUBJECTIVE:   CHIEF COMPLAINT / HPI:   Linda Nunez is a 28 y.o. female presenting for birth control counseling and Nexplanon  removal.   Nexplanon  placed 12/2020. Reports using condoms with all sexual encounters. Not desiring pregnancy for another 2-3 years.   Does not want nexplanon  replaced due to arm pain around site of insertion.   PERTINENT  PMH / PSH: reviewed and updated.  OBJECTIVE:   BP 126/80   Pulse 68   Wt 205 lb (93 kg)   LMP 01/19/2024   SpO2 98%   BMI 35.19 kg/m   Well-appearing, no acute distress Cardio: Regular rate, regular rhythm, no murmurs on exam. Pulm: Clear, no wheezing, no crackles. No increased work of breathing  ASSESSMENT/PLAN:   Assessment & Plan Birth control counseling Discussed different options for birth control. She will consider IUD. Would like rx for OCPs.  Denies Hx of blood clots and migraines  Refilled prior script  Encounter for Nexplanon  removal Encounter for removal of subdermal contraceptive implant Nexplanon  Removal  After verbal and written consent was obtained, patient was placed in supine position. The patient's left arm was flexed at the elbow and externally rotated so that the wrist was parallel to their ear. The device was palpated and marked. The area was cleansed with alcohol and 1% lidocaine  with epinephrine was injected just under the distal end of the device. The site was cleaned with Betadine x3 and the area surrounding the device was covered with a sterile drape. A scalpel was used to create a small incision, and the device was pushed towards the incision. Fibrous tissue surrounding the device was gradually removed from the device. The device was grasped, removed and measured to ensure all 4 cm of device was removed. Steri-strips were used to close the incision. Pressure dressing was applied to the patient. The patient was instructed to remove the pressure dressing in 24 hours, and use backup contraception for 1  week. The patient denied any concerns or complaints and tolerated the procedure well.             Damien Pinal, DO [facility]

## 2024-03-26 ENCOUNTER — Other Ambulatory Visit: Payer: Self-pay

## 2024-03-26 ENCOUNTER — Emergency Department (HOSPITAL_COMMUNITY)
Admission: EM | Admit: 2024-03-26 | Discharge: 2024-03-27 | Disposition: A | Attending: Emergency Medicine | Admitting: Emergency Medicine

## 2024-03-26 ENCOUNTER — Encounter (HOSPITAL_COMMUNITY): Payer: Self-pay

## 2024-03-26 DIAGNOSIS — Z91040 Latex allergy status: Secondary | ICD-10-CM | POA: Diagnosis not present

## 2024-03-26 DIAGNOSIS — E860 Dehydration: Secondary | ICD-10-CM | POA: Diagnosis not present

## 2024-03-26 DIAGNOSIS — R112 Nausea with vomiting, unspecified: Secondary | ICD-10-CM | POA: Insufficient documentation

## 2024-03-26 DIAGNOSIS — J069 Acute upper respiratory infection, unspecified: Secondary | ICD-10-CM | POA: Insufficient documentation

## 2024-03-26 LAB — CBC WITH DIFFERENTIAL/PLATELET
Abs Immature Granulocytes: 0.01 K/uL (ref 0.00–0.07)
Basophils Absolute: 0 K/uL (ref 0.0–0.1)
Basophils Relative: 1 %
Eosinophils Absolute: 0.2 K/uL (ref 0.0–0.5)
Eosinophils Relative: 4 %
HCT: 42.6 % (ref 36.0–46.0)
Hemoglobin: 15.2 g/dL — ABNORMAL HIGH (ref 12.0–15.0)
Immature Granulocytes: 0 %
Lymphocytes Relative: 51 %
Lymphs Abs: 2.9 K/uL (ref 0.7–4.0)
MCH: 32.3 pg (ref 26.0–34.0)
MCHC: 35.7 g/dL (ref 30.0–36.0)
MCV: 90.4 fL (ref 80.0–100.0)
Monocytes Absolute: 0.4 K/uL (ref 0.1–1.0)
Monocytes Relative: 7 %
Neutro Abs: 2.1 K/uL (ref 1.7–7.7)
Neutrophils Relative %: 37 %
Platelets: 332 K/uL (ref 150–400)
RBC: 4.71 MIL/uL (ref 3.87–5.11)
RDW: 11.7 % (ref 11.5–15.5)
WBC: 5.6 K/uL (ref 4.0–10.5)
nRBC: 0 % (ref 0.0–0.2)

## 2024-03-26 MED ORDER — ONDANSETRON HCL 4 MG/2ML IJ SOLN
4.0000 mg | Freq: Once | INTRAMUSCULAR | Status: AC
  Administered 2024-03-26: 4 mg via INTRAVENOUS
  Filled 2024-03-26: qty 2

## 2024-03-26 MED ORDER — SODIUM CHLORIDE 0.9 % IV BOLUS
1000.0000 mL | Freq: Once | INTRAVENOUS | Status: AC
  Administered 2024-03-26: 1000 mL via INTRAVENOUS

## 2024-03-26 NOTE — ED Provider Notes (Signed)
 " [place] EMERGENCY DEPARTMENT AT [facility] Provider Note   CSN: [id number] Arrival date & time: 03/26/24  2142     Patient presents with: Emesis   Linda Nunez is a 29 y.o. female.  {Add pertinent medical, surgical, social history, OB history to YEP:67052} The history is provided by the patient.  Emesis Linda Nunez is a 29 y.o. female who presents to the Emergency Department complaining of *** Friday felt weak/fatigued, felt hot and cold, cough runny nose. At fruit and had pedialyte couldn't eat food.  Today vomiting all day. No diarrhea. No AP.  Has pain in ribs with coughing. Mild sob. No fever.  No dysuria, discharge.   No meds, no medical problems.  LMP 12/28.      Prior to Admission medications  Medication Sig Start Date End Date Taking? Authorizing Provider  metFORMIN  (GLUCOPHAGE -XR) 500 MG 24 hr tablet TAKE 1 TABLET EVERY MORNING X 7 DAILY,1 TWICE DAILY X 7 DAILY,2 TABS EVERY MORNING AND 1 AT BEDTIME X 7 DAILY, 2 TABS TWICE DAILY 04/06/22   Lilland, Alana, DO  norgestimate -ethinyl estradiol  (ORTHO-CYCLEN) 0.25-35 MG-MCG tablet Take 1 tablet by mouth daily. 02/01/24   Cleotilde Perkins, DO    Allergies: Latex    Review of Systems  Gastrointestinal:  Positive for vomiting.  All other systems reviewed and are negative.   Updated Vital Signs BP (!) 129/93   Pulse (!) 108   Temp 98.7 F (37.1 C) (Oral)   Resp 19   LMP 03/19/2024 (Exact Date)   SpO2 96%   Physical Exam Vitals and nursing note reviewed.  Constitutional:      Appearance: She is well-developed.  HENT:     Head: Normocephalic and atraumatic.  Cardiovascular:     Rate and Rhythm: Normal rate and regular rhythm.     Heart sounds: No murmur heard. Pulmonary:     Effort: Pulmonary effort is normal. No respiratory distress.     Breath sounds: Normal breath sounds.  Abdominal:     Palpations: Abdomen is soft.     Tenderness: There is no abdominal tenderness. There is no guarding or  rebound.  Musculoskeletal:        General: No swelling or tenderness.  Skin:    General: Skin is warm and dry.  Neurological:     Mental Status: She is alert and oriented to person, place, and time.  Psychiatric:        Behavior: Behavior normal.     (all labs ordered are listed, but only abnormal results are displayed) Labs Reviewed - No data to display  EKG: None  Radiology: No results found.  {Document cardiac monitor, telemetry assessment procedure when appropriate:32947} Procedures   Medications Ordered in the ED - No data to display    {Click here for ABCD2, HEART and other calculators REFRESH Note before signing:1}                              Medical Decision Making  ***  {Document critical care time when appropriate  Document review of labs and clinical decision tools ie CHADS2VASC2, etc  Document your independent review of radiology images and any outside records  Document your discussion with family members, caretakers and with consultants  Document social determinants of health affecting pt's care  Document your decision making why or why not admission, treatments were needed:32947:::1}   Final diagnoses:  None    ED  Discharge Orders     None        "

## 2024-03-26 NOTE — ED Triage Notes (Signed)
 Pt states she has had generalized unwell feeling x 2 days but started vomiting today. Pt denies abdominal pain. She does also report cough and congestion

## 2024-03-27 LAB — COMPREHENSIVE METABOLIC PANEL WITH GFR
ALT: 19 U/L (ref 0–44)
AST: 31 U/L (ref 15–41)
Albumin: 4.6 g/dL (ref 3.5–5.0)
Alkaline Phosphatase: 59 U/L (ref 38–126)
Anion gap: 17 — ABNORMAL HIGH (ref 5–15)
BUN: 16 mg/dL (ref 6–20)
CO2: 21 mmol/L — ABNORMAL LOW (ref 22–32)
Calcium: 9.7 mg/dL (ref 8.9–10.3)
Chloride: 102 mmol/L (ref 98–111)
Creatinine, Ser: 0.93 mg/dL (ref 0.44–1.00)
GFR, Estimated: >60 mL/min
Glucose, Bld: 87 mg/dL (ref 70–99)
Potassium: 3.6 mmol/L (ref 3.5–5.1)
Sodium: 139 mmol/L (ref 135–145)
Total Bilirubin: 0.6 mg/dL (ref 0.0–1.2)
Total Protein: 8.3 g/dL — ABNORMAL HIGH (ref 6.5–8.1)

## 2024-03-27 LAB — LIPASE, BLOOD: Lipase: 13 U/L (ref 11–51)

## 2024-03-27 LAB — HCG, SERUM, QUALITATIVE: Preg, Serum: NEGATIVE

## 2024-03-27 MED ORDER — ONDANSETRON 4 MG PO TBDP: 4.0000 mg | ORAL_TABLET | Freq: Three times a day (TID) | ORAL | 0 refills | Status: AC | PRN

## 2024-03-27 NOTE — ED Notes (Signed)
Pt given soda and crackers.
